# Patient Record
Sex: Female | Born: 1970 | Race: Black or African American | Hispanic: No | Marital: Single | Smoking: Former smoker
Health system: Southern US, Community
[De-identification: ages and names within clinical notes are randomized; demographics above are authoritative.]

## PROBLEM LIST (undated history)

## (undated) DIAGNOSIS — M109 Gout, unspecified: Secondary | ICD-10-CM

## (undated) DIAGNOSIS — I10 Essential (primary) hypertension: Secondary | ICD-10-CM

## (undated) DIAGNOSIS — K219 Gastro-esophageal reflux disease without esophagitis: Secondary | ICD-10-CM

## (undated) DIAGNOSIS — R011 Cardiac murmur, unspecified: Secondary | ICD-10-CM

## (undated) DIAGNOSIS — R7303 Prediabetes: Secondary | ICD-10-CM

## (undated) DIAGNOSIS — M1990 Unspecified osteoarthritis, unspecified site: Secondary | ICD-10-CM

## (undated) DIAGNOSIS — S0500XA Injury of conjunctiva and corneal abrasion without foreign body, unspecified eye, initial encounter: Secondary | ICD-10-CM

## (undated) DIAGNOSIS — R519 Headache, unspecified: Secondary | ICD-10-CM

## (undated) DIAGNOSIS — A609 Anogenital herpesviral infection, unspecified: Secondary | ICD-10-CM

## (undated) HISTORY — PX: TUBAL LIGATION: SHX77

## (undated) HISTORY — PX: WISDOM TOOTH EXTRACTION: SHX21

## (undated) HISTORY — DX: Unspecified osteoarthritis, unspecified site: M19.90

## (undated) HISTORY — DX: Gout, unspecified: M10.9

## (undated) HISTORY — DX: Essential (primary) hypertension: I10

---
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
## 1995-11-24 HISTORY — PX: TUBAL LIGATION: SHX77

## 2000-05-23 ENCOUNTER — Encounter: Payer: Self-pay | Admitting: Emergency Medicine

## 2000-05-23 ENCOUNTER — Emergency Department (HOSPITAL_COMMUNITY): Admission: EM | Admit: 2000-05-23 | Discharge: 2000-05-23 | Payer: Self-pay | Admitting: Emergency Medicine

## 2000-06-15 ENCOUNTER — Other Ambulatory Visit: Admission: RE | Admit: 2000-06-15 | Discharge: 2000-06-15 | Payer: Self-pay | Admitting: Obstetrics and Gynecology

## 2005-06-01 ENCOUNTER — Ambulatory Visit: Payer: Self-pay | Admitting: Family Medicine

## 2005-08-26 ENCOUNTER — Ambulatory Visit: Payer: Self-pay | Admitting: Internal Medicine

## 2005-10-02 ENCOUNTER — Encounter: Admission: RE | Admit: 2005-10-02 | Discharge: 2005-10-02 | Payer: Self-pay | Admitting: Neurology

## 2005-11-01 ENCOUNTER — Encounter: Admission: RE | Admit: 2005-11-01 | Discharge: 2005-11-01 | Payer: Self-pay | Admitting: Neurology

## 2006-04-02 ENCOUNTER — Other Ambulatory Visit: Admission: RE | Admit: 2006-04-02 | Discharge: 2006-04-02 | Payer: Self-pay | Admitting: Gynecology

## 2006-11-23 DIAGNOSIS — F410 Panic disorder [episodic paroxysmal anxiety] without agoraphobia: Secondary | ICD-10-CM

## 2006-11-23 HISTORY — DX: Panic disorder (episodic paroxysmal anxiety): F41.0

## 2008-04-20 ENCOUNTER — Telehealth: Payer: Self-pay | Admitting: *Deleted

## 2008-04-21 ENCOUNTER — Ambulatory Visit: Payer: Self-pay | Admitting: Family Medicine

## 2008-04-21 DIAGNOSIS — H6690 Otitis media, unspecified, unspecified ear: Secondary | ICD-10-CM | POA: Insufficient documentation

## 2008-04-21 DIAGNOSIS — L255 Unspecified contact dermatitis due to plants, except food: Secondary | ICD-10-CM | POA: Insufficient documentation

## 2008-05-03 ENCOUNTER — Other Ambulatory Visit: Admission: RE | Admit: 2008-05-03 | Discharge: 2008-05-03 | Payer: Self-pay | Admitting: Gynecology

## 2008-06-04 ENCOUNTER — Ambulatory Visit: Payer: Self-pay | Admitting: Family Medicine

## 2008-06-04 ENCOUNTER — Telehealth: Payer: Self-pay | Admitting: Family Medicine

## 2009-04-03 ENCOUNTER — Emergency Department (HOSPITAL_COMMUNITY): Admission: EM | Admit: 2009-04-03 | Discharge: 2009-04-03 | Payer: Self-pay | Admitting: Emergency Medicine

## 2009-12-20 ENCOUNTER — Ambulatory Visit: Payer: Self-pay | Admitting: Gynecology

## 2009-12-20 ENCOUNTER — Other Ambulatory Visit: Admission: RE | Admit: 2009-12-20 | Discharge: 2009-12-20 | Payer: Self-pay | Admitting: Gynecology

## 2010-02-11 ENCOUNTER — Ambulatory Visit: Payer: Self-pay | Admitting: Gynecology

## 2010-02-12 ENCOUNTER — Ambulatory Visit: Payer: Self-pay | Admitting: Gynecology

## 2010-03-10 ENCOUNTER — Emergency Department (HOSPITAL_COMMUNITY): Admission: EM | Admit: 2010-03-10 | Discharge: 2010-03-10 | Payer: Self-pay | Admitting: Emergency Medicine

## 2010-03-26 ENCOUNTER — Ambulatory Visit: Payer: Self-pay | Admitting: Gynecology

## 2010-08-13 ENCOUNTER — Ambulatory Visit: Payer: Self-pay | Admitting: Gynecology

## 2010-08-20 ENCOUNTER — Ambulatory Visit: Payer: Self-pay | Admitting: Gynecology

## 2011-02-10 LAB — POCT CARDIAC MARKERS: Myoglobin, poc: 41.9 ng/mL (ref 12–200)

## 2011-02-10 LAB — URINALYSIS, ROUTINE W REFLEX MICROSCOPIC
Protein, ur: NEGATIVE mg/dL
Urobilinogen, UA: 0.2 mg/dL (ref 0.0–1.0)

## 2011-02-10 LAB — POCT I-STAT, CHEM 8
BUN: 7 mg/dL (ref 6–23)
Calcium, Ion: 1.16 mmol/L (ref 1.12–1.32)
Creatinine, Ser: 0.9 mg/dL (ref 0.4–1.2)
Glucose, Bld: 96 mg/dL (ref 70–99)
TCO2: 27 mmol/L (ref 0–100)

## 2011-02-10 LAB — URINE MICROSCOPIC-ADD ON

## 2011-03-03 LAB — URINALYSIS, ROUTINE W REFLEX MICROSCOPIC
Glucose, UA: NEGATIVE mg/dL
Protein, ur: NEGATIVE mg/dL
pH: 7.5 (ref 5.0–8.0)

## 2011-03-03 LAB — CBC
Hemoglobin: 12.8 g/dL (ref 12.0–15.0)
MCHC: 35.6 g/dL (ref 30.0–36.0)
RDW: 12.6 % (ref 11.5–15.5)

## 2011-03-03 LAB — POCT I-STAT, CHEM 8
BUN: 10 mg/dL (ref 6–23)
Calcium, Ion: 1.21 mmol/L (ref 1.12–1.32)
Creatinine, Ser: 0.9 mg/dL (ref 0.4–1.2)
Glucose, Bld: 91 mg/dL (ref 70–99)
TCO2: 26 mmol/L (ref 0–100)

## 2011-03-03 LAB — URINE MICROSCOPIC-ADD ON

## 2011-03-03 LAB — SALICYLATE LEVEL: Salicylate Lvl: <4 mg/dL (ref 2.8–20.0)

## 2011-04-28 ENCOUNTER — Ambulatory Visit (INDEPENDENT_AMBULATORY_CARE_PROVIDER_SITE_OTHER): Payer: 59 | Admitting: Gynecology

## 2011-04-28 DIAGNOSIS — N946 Dysmenorrhea, unspecified: Secondary | ICD-10-CM

## 2011-04-28 DIAGNOSIS — N920 Excessive and frequent menstruation with regular cycle: Secondary | ICD-10-CM

## 2011-04-28 DIAGNOSIS — R823 Hemoglobinuria: Secondary | ICD-10-CM

## 2011-05-25 ENCOUNTER — Encounter: Payer: 59 | Admitting: Gynecology

## 2011-06-04 ENCOUNTER — Other Ambulatory Visit (HOSPITAL_COMMUNITY)
Admission: RE | Admit: 2011-06-04 | Discharge: 2011-06-04 | Disposition: A | Discharge status: Home / Self Care | Payer: 59 | Source: Ambulatory Visit | Attending: Gynecology | Admitting: Gynecology

## 2011-06-04 ENCOUNTER — Other Ambulatory Visit: Payer: Self-pay | Admitting: Gynecology

## 2011-06-04 ENCOUNTER — Encounter (INDEPENDENT_AMBULATORY_CARE_PROVIDER_SITE_OTHER): Payer: 59 | Admitting: Gynecology

## 2011-06-04 DIAGNOSIS — Z13220 Encounter for screening for lipoid disorders: Secondary | ICD-10-CM

## 2011-06-04 DIAGNOSIS — Z01419 Encounter for gynecological examination (general) (routine) without abnormal findings: Secondary | ICD-10-CM

## 2011-06-04 DIAGNOSIS — Z124 Encounter for screening for malignant neoplasm of cervix: Secondary | ICD-10-CM | POA: Insufficient documentation

## 2011-06-04 DIAGNOSIS — Z833 Family history of diabetes mellitus: Secondary | ICD-10-CM

## 2011-12-21 ENCOUNTER — Other Ambulatory Visit: Payer: Self-pay | Admitting: Family Medicine

## 2011-12-21 DIAGNOSIS — R109 Unspecified abdominal pain: Secondary | ICD-10-CM

## 2011-12-23 ENCOUNTER — Ambulatory Visit
Admission: RE | Admit: 2011-12-23 | Discharge: 2011-12-23 | Disposition: A | Discharge status: Home / Self Care | Payer: 59 | Source: Ambulatory Visit | Attending: Family Medicine | Admitting: Family Medicine

## 2011-12-23 DIAGNOSIS — R109 Unspecified abdominal pain: Secondary | ICD-10-CM

## 2011-12-23 MED ORDER — IOHEXOL 300 MG/ML  SOLN
125.0000 mL | Freq: Once | INTRAMUSCULAR | Status: AC | PRN
  Administered 2011-12-23: 125 mL via INTRAVENOUS

## 2012-08-04 ENCOUNTER — Other Ambulatory Visit (HOSPITAL_COMMUNITY)
Admission: RE | Admit: 2012-08-04 | Discharge: 2012-08-04 | Disposition: A | Discharge status: Home / Self Care | Payer: 59 | Source: Ambulatory Visit | Attending: Family Medicine | Admitting: Family Medicine

## 2012-08-04 ENCOUNTER — Other Ambulatory Visit: Payer: Self-pay | Admitting: Family Medicine

## 2012-08-04 DIAGNOSIS — Z0000 Encounter for general adult medical examination without abnormal findings: Secondary | ICD-10-CM | POA: Insufficient documentation

## 2012-10-26 ENCOUNTER — Encounter: Payer: Self-pay | Admitting: Gynecology

## 2012-10-26 ENCOUNTER — Ambulatory Visit (INDEPENDENT_AMBULATORY_CARE_PROVIDER_SITE_OTHER): Payer: 59 | Admitting: Gynecology

## 2012-10-26 DIAGNOSIS — B9689 Other specified bacterial agents as the cause of diseases classified elsewhere: Secondary | ICD-10-CM

## 2012-10-26 DIAGNOSIS — A499 Bacterial infection, unspecified: Secondary | ICD-10-CM

## 2012-10-26 DIAGNOSIS — R102 Pelvic and perineal pain: Secondary | ICD-10-CM

## 2012-10-26 DIAGNOSIS — N949 Unspecified condition associated with female genital organs and menstrual cycle: Secondary | ICD-10-CM

## 2012-10-26 DIAGNOSIS — N760 Acute vaginitis: Secondary | ICD-10-CM

## 2012-10-26 DIAGNOSIS — R109 Unspecified abdominal pain: Secondary | ICD-10-CM

## 2012-10-26 LAB — WET PREP FOR TRICH, YEAST, CLUE: Trich, Wet Prep: NONE SEEN

## 2012-10-26 LAB — URINALYSIS W MICROSCOPIC + REFLEX CULTURE
Bilirubin Urine: NEGATIVE
Glucose, UA: NEGATIVE mg/dL
Hgb urine dipstick: NEGATIVE
Ketones, ur: NEGATIVE mg/dL
Protein, ur: NEGATIVE mg/dL

## 2012-10-26 MED ORDER — METRONIDAZOLE 500 MG PO TABS: 500.0000 mg | ORAL_TABLET | Freq: Two times a day (BID) | ORAL | Status: DC

## 2012-10-26 NOTE — Patient Instructions (Signed)
Take Flagyl medication twice daily for 7 days.no alcohol while taking. If abdominal discomfort continues call we'll schedule ultrasound. If it resolves and follow up at your planned annual exam.

## 2012-10-26 NOTE — Progress Notes (Signed)
Patient presents with two-day history of lower abdominal discomfort. This is she feels pressure symptoms and gassy. Also some vaginal moisture no real discharge no odor or itching. Menses are regular, relatively light.  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 Cervix normal. Uterus normal size midline mobile nontender. Adnexa without masses or tenderness. Rectovaginal exam is normal.  Assessment and plan:  1. Slight white discharge with wet prep consistent with bacterial vaginosis. Treat with Flagyl 500 mg twice a day x7 days. 2. Lower abdominal pressure symptoms. Urinalysis cloudy but no other abnormalities such as white cells or hemoglobin. Check culture and follow up. Cervical culture GC Chlamydia also done. With constipation symptoms and gassy complaints recommend pushing fluids and starting on a stool softener. We'll see if this doesn't resolve her discomfort. If so then we'll follow expectantly with planned annual exam scheduled 17 December. If discomfort continues she knows to call and we'll schedule a GYN ultrasound. Follow up on urine culture and cervical cultures and treat accordingly.

## 2012-10-28 LAB — URINE CULTURE: Colony Count: 9000

## 2012-11-08 ENCOUNTER — Encounter: Payer: 59 | Admitting: Gynecology

## 2012-11-09 ENCOUNTER — Encounter: Payer: Self-pay | Admitting: Gynecology

## 2012-11-09 ENCOUNTER — Ambulatory Visit (INDEPENDENT_AMBULATORY_CARE_PROVIDER_SITE_OTHER): Payer: 59 | Admitting: Gynecology

## 2012-11-09 VITALS — BP 130/70 | Ht 63.0 in | Wt 224.0 lb

## 2012-11-09 DIAGNOSIS — Z13220 Encounter for screening for lipoid disorders: Secondary | ICD-10-CM

## 2012-11-09 DIAGNOSIS — Z01419 Encounter for gynecological examination (general) (routine) without abnormal findings: Secondary | ICD-10-CM

## 2012-11-09 DIAGNOSIS — R1032 Left lower quadrant pain: Secondary | ICD-10-CM

## 2012-11-09 DIAGNOSIS — R350 Frequency of micturition: Secondary | ICD-10-CM

## 2012-11-09 LAB — COMPREHENSIVE METABOLIC PANEL
Alkaline Phosphatase: 58 U/L (ref 39–117)
CO2: 28 mEq/L (ref 19–32)
Creat: 0.91 mg/dL (ref 0.50–1.10)
Glucose, Bld: 90 mg/dL (ref 70–99)
Sodium: 140 mEq/L (ref 135–145)
Total Bilirubin: 0.5 mg/dL (ref 0.3–1.2)
Total Protein: 6.7 g/dL (ref 6.0–8.3)

## 2012-11-09 LAB — CBC WITH DIFFERENTIAL/PLATELET
Basophils Relative: 0 % (ref 0–1)
Eosinophils Absolute: 0.1 10*3/uL (ref 0.0–0.7)
Eosinophils Relative: 1 % (ref 0–5)
Hemoglobin: 12.9 g/dL (ref 12.0–15.0)
MCH: 31 pg (ref 26.0–34.0)
MCHC: 33.7 g/dL (ref 30.0–36.0)
MCV: 92.1 fL (ref 78.0–100.0)
Monocytes Relative: 10 % (ref 3–12)
Neutrophils Relative %: 51 % (ref 43–77)
Platelets: 358 10*3/uL (ref 150–400)

## 2012-11-09 LAB — LIPID PANEL
Cholesterol: 164 mg/dL (ref 0–200)
HDL: 49 mg/dL (ref >39)
LDL Cholesterol: 96 mg/dL (ref 0–99)
Total CHOL/HDL Ratio: 3.3 Ratio
Triglycerides: 96 mg/dL (ref <150)
VLDL: 19 mg/dL (ref 0–40)

## 2012-11-09 LAB — URINALYSIS W MICROSCOPIC + REFLEX CULTURE
Bilirubin Urine: NEGATIVE
Hgb urine dipstick: NEGATIVE
Ketones, ur: NEGATIVE mg/dL
Nitrite: NEGATIVE
Urobilinogen, UA: 0.2 mg/dL (ref 0.0–1.0)

## 2012-11-09 NOTE — Progress Notes (Signed)
ESLY SELVAGE 1971/02/12 [id number]        41 y.o.  G3P0010 for annual exam.    Past medical history,surgical history, medications, allergies, family history and social history were all reviewed and documented in the EPIC chart. ROS:  Was performed and pertinent positives and negatives are included in the history.  Exam: Sherrilyn Rist assistant Filed Vitals:   11/09/12 1125  BP: 130/70  Height: 5\' 3"  (1.6 m)  Weight: 224 lb (101.606 kg)   General appearance  Normal Skin grossly normal Head/Neck normal with no cervical or supraclavicular adenopathy thyroid normal Lungs  clear Cardiac RR, without RMG Abdominal  soft, nontender, without masses, organomegaly or hernia Breasts  examined lying and sitting without masses, retractions, discharge or axillary adenopathy. Pelvic  Ext/BUS/vagina  normal   Cervix  normal   Uterus  axial, normal size, shape and contour, midline and mobile nontender   Adnexa  Without masses or tenderness    Anus and perineum  normal   Rectovaginal  normal sphincter tone without palpated masses or tenderness.    Assessment/Plan:  41 y.o. G43P0010 female for annual exam, regular menses, tubal sterilization.   1. Left lower quadrant discomfort.  Was seen in early December with similar complaints. Felt due to constipation. GC Chlamydia screen negative urinalysis negative. Patient increased her fiber and stooling and notes some improvement. Still has some nagging left lower quadrant discomfort. Her exam is normal we'll start with ultrasound rule out nonpalpable abnormalities. Assuming negative then we'll plan expectant management at present. We'll continue they consider GI referral. Urinalysis negative today. 2. Fleeting right and left breast discomfort. Comes and goes. No persistent pain or masses. Exam is normal. Discussed with patient that I think this is physiologic in response to her menstrual cycle. Recommend baseline mammogram now she agrees to schedule. Otherwise  assuming negative she'll continue with suppressed exams monthly and as long as no persistent palpable abnormalities or persistent pain we'll follow expectantly. 3. Pap smear 07/2012 period was just done by primary during a visit in its normal. No history of significant abnormalities and recommend repeat at 3 year interval as HPV was not done with it. 4. Health maintenance. Check baseline CBC comprehensive metabolic panel lipid profile. Follow for ultrasound, otherwise annually.   Dara Lords MD, 12:03 PM 11/09/2012

## 2012-11-09 NOTE — Patient Instructions (Signed)
Follow up for ultrasound as scheduled 

## 2012-11-25 ENCOUNTER — Ambulatory Visit: Payer: 59 | Admitting: Gynecology

## 2012-11-25 ENCOUNTER — Other Ambulatory Visit: Payer: 59

## 2012-12-16 ENCOUNTER — Encounter: Payer: Self-pay | Admitting: Gynecology

## 2012-12-16 ENCOUNTER — Ambulatory Visit (INDEPENDENT_AMBULATORY_CARE_PROVIDER_SITE_OTHER): Payer: 59 | Admitting: Gynecology

## 2012-12-16 ENCOUNTER — Ambulatory Visit: Payer: 59

## 2012-12-16 DIAGNOSIS — R1032 Left lower quadrant pain: Secondary | ICD-10-CM

## 2012-12-16 DIAGNOSIS — IMO0002 Reserved for concepts with insufficient information to code with codable children: Secondary | ICD-10-CM

## 2012-12-16 NOTE — Patient Instructions (Signed)
Make a follow up appointment with gastroenterology to follow up with about your abdominal pain.

## 2012-12-16 NOTE — Progress Notes (Signed)
Patient follows up for ultrasound due to left lower quadrant discomfort.  Ultrasound shows uterus normal size and echotexture. Endometrial echo 5.8 mm. Right and left ovaries are visualized and normal. Cul-de-sac negative.  Assessment and plan: Lower bowel discomfort, ultrasound is normal. I think this is more GI related and she has a name of a gastroenterologist and she is going to call and make an appointment to see them.

## 2013-06-23 ENCOUNTER — Emergency Department (HOSPITAL_COMMUNITY): Payer: 59

## 2013-06-23 ENCOUNTER — Emergency Department (HOSPITAL_COMMUNITY)
Admission: EM | Admit: 2013-06-23 | Discharge: 2013-06-23 | Disposition: A | Discharge status: Home / Self Care | Payer: 59 | Attending: Emergency Medicine | Admitting: Emergency Medicine

## 2013-06-23 ENCOUNTER — Encounter (HOSPITAL_COMMUNITY): Payer: Self-pay | Admitting: *Deleted

## 2013-06-23 DIAGNOSIS — M79601 Pain in right arm: Secondary | ICD-10-CM

## 2013-06-23 DIAGNOSIS — Z87891 Personal history of nicotine dependence: Secondary | ICD-10-CM | POA: Insufficient documentation

## 2013-06-23 DIAGNOSIS — Z79899 Other long term (current) drug therapy: Secondary | ICD-10-CM | POA: Insufficient documentation

## 2013-06-23 DIAGNOSIS — I10 Essential (primary) hypertension: Secondary | ICD-10-CM | POA: Insufficient documentation

## 2013-06-23 DIAGNOSIS — R209 Unspecified disturbances of skin sensation: Secondary | ICD-10-CM | POA: Insufficient documentation

## 2013-06-23 DIAGNOSIS — M79609 Pain in unspecified limb: Secondary | ICD-10-CM | POA: Insufficient documentation

## 2013-06-23 LAB — POCT I-STAT, CHEM 8
BUN: 8 mg/dL (ref 6–23)
Calcium, Ion: 1.18 mmol/L (ref 1.12–1.23)
Chloride: 105 mEq/L (ref 96–112)
Creatinine, Ser: 1 mg/dL (ref 0.50–1.10)
Glucose, Bld: 107 mg/dL — ABNORMAL HIGH (ref 70–99)
Potassium: 3.9 mEq/L (ref 3.5–5.1)

## 2013-06-23 LAB — CBC
Hemoglobin: 12.7 g/dL (ref 12.0–15.0)
MCH: 32.4 pg (ref 26.0–34.0)
MCHC: 35.7 g/dL (ref 30.0–36.0)
Platelets: 309 10*3/uL (ref 150–400)
RDW: 13.4 % (ref 11.5–15.5)

## 2013-06-23 MED ORDER — HYDROMORPHONE HCL PF 1 MG/ML IJ SOLN
1.0000 mg | Freq: Once | INTRAMUSCULAR | Status: AC
  Administered 2013-06-23: 1 mg via INTRAMUSCULAR
  Filled 2013-06-23: qty 1

## 2013-06-23 MED ORDER — TRAMADOL HCL 50 MG PO TABS: 50.0000 mg | ORAL_TABLET | Freq: Four times a day (QID) | ORAL | Status: DC | PRN

## 2013-06-23 MED ORDER — ONDANSETRON 4 MG PO TBDP
4.0000 mg | ORAL_TABLET | Freq: Once | ORAL | Status: AC
  Administered 2013-06-23: 4 mg via ORAL
  Filled 2013-06-23: qty 1

## 2013-06-23 NOTE — ED Notes (Signed)
Back from xray

## 2013-06-23 NOTE — ED Notes (Signed)
Pt to xray, no changes, alert, NAD, calm. 

## 2013-06-23 NOTE — ED Notes (Addendum)
C/o R arm pain, (hand to shoulder and axilla). Also describes as numbness. (Denies: neck, back, scapular, chest or abd pain; Denies: injury, nvd, fever, recent illness, cough, congestion cold sx, dizziness, palpitations, sob). No aggravating or aleviating factors. H/o similar. Describes as recurrent. Comes and goes. Has been on mobic and muscle relaxers for similar sx. Was sleeping at onset. Woke pt up around 0400. No aggravating or aleviating factors. No meds PTA.

## 2013-06-23 NOTE — ED Notes (Signed)
EDP finished at [facility], lab into room, pt given warm blanket.

## 2013-06-23 NOTE — ED Provider Notes (Signed)
CSN: [id number]     Arrival date & time 06/23/13  0445 History     First MD Initiated Contact with Patient 06/23/13 0445     Chief Complaint  Patient presents with  . Arm Pain   (Consider location/radiation/quality/duration/timing/severity/associated sxs/prior Treatment) HPI History provided by patient. Right arm pain from her shoulder to her hand with associated numbness on-and-off for the last year. No known aggravating or alleviating factors. No injury. Not affected by movement. She denies any neck pain. Patient is mostly worried due to family history of lung cancer - her grandmother had arm pain and was later diagnosed as pain related to lung mass. She was a previous smoker. No shortness of breath. No chest pain. No history of diabetes. She takes furosemide for hypertension. Pain started this time around 4 AM, she is very anxious today, no new symptoms. Past Medical History  Diagnosis Date  . Hypertension    Past Surgical History  Procedure Laterality Date  . Tubal ligation      POST PARTUM   Family History  Problem Relation Age of Onset  . Cancer Maternal Aunt     LUNG  . Diabetes Maternal Grandmother   . Hypertension Maternal Grandmother    History  Substance Use Topics  . Smoking status: Former Games developer  . Smokeless tobacco: Never Used     Comment: was only a social smoker  . Alcohol Use: Yes   OB History   Grav Para Term Preterm Abortions TAB SAB Ect Mult Living   3 2   1  1         Review of Systems  Constitutional: Negative for fever and chills.  HENT: Negative for neck pain and neck stiffness.   Eyes: Negative for pain.  Respiratory: Negative for shortness of breath.   Cardiovascular: Negative for chest pain.  Gastrointestinal: Negative for abdominal pain.  Genitourinary: Negative for dysuria.  Musculoskeletal: Negative for back pain.  Skin: Negative for rash.  Neurological: Positive for numbness. Negative for headaches.  All other systems reviewed and are  negative.    Allergies  Review of patient's allergies indicates no known allergies.  Home Medications   Current Outpatient Rx  Name  Route  Sig  Dispense  Refill  . FUROSEMIDE PO   Oral   Take by mouth.          BP 119/76  Pulse 65  Temp(Src) 98.6 F (37 C) (Oral)  Resp 18  SpO2 100%  LMP 05/29/2013 Physical Exam  Constitutional: She is oriented to person, place, and time. She appears well-developed and well-nourished.  HENT:  Head: Normocephalic and atraumatic.  Eyes: EOM are normal. Pupils are equal, round, and reactive to light.  Neck: Neck supple.  No C-spine tenderness or deformity  Cardiovascular: Normal rate, regular rhythm and intact distal pulses.   Pulmonary/Chest: Effort normal. No respiratory distress. She exhibits no tenderness.  Abdominal: Soft. Bowel sounds are normal.  Musculoskeletal: Normal range of motion. She exhibits no edema and no tenderness.  Equal grips/biceps/biceps strengths. Distal neurovascular intact and equal to upper extremities. Sensorium to light touch equal and intact throughout. No swelling or edema. No erythema or increased warmth to touch.  Neurological: She is alert and oriented to person, place, and time.  Skin: Skin is warm and dry.    ED Course   Procedures (including critical care time)  Results for orders placed during the hospital encounter of 06/23/13  CBC      Result Value Range  WBC 8.3  4.0 - 10.5 K/uL   RBC 3.92  3.87 - 5.11 MIL/uL   Hemoglobin 12.7  12.0 - 15.0 g/dL   HCT 45.4 (*) 09.8 - 11.9 %   MCV 90.8  78.0 - 100.0 fL   MCH 32.4  26.0 - 34.0 pg   MCHC 35.7  30.0 - 36.0 g/dL   RDW 14.7  82.9 - 56.2 %   Platelets 309  150 - 400 K/uL  POCT I-STAT, CHEM 8      Result Value Range   Sodium 140  135 - 145 mEq/L   Potassium 3.9  3.5 - 5.1 mEq/L   Chloride 105  96 - 112 mEq/L   BUN 8  6 - 23 mg/dL   Creatinine, Ser 1.30  0.50 - 1.10 mg/dL   Glucose, Bld 865 (*) 70 - 99 mg/dL   Calcium, Ion 7.84  6.96 -  1.23 mmol/L   TCO2 22  0 - 100 mmol/L   Hemoglobin 12.9  12.0 - 15.0 g/dL   HCT 29.5  28.4 - 13.2 %  POCT I-STAT TROPONIN I      Result Value Range   Troponin i, poc 0.00  0.00 - 0.08 ng/mL   Comment 3            Dg Chest 2 View  06/23/2013   *RADIOLOGY REPORT*  Clinical Data: Chest and arm pain.  CHEST - 2 VIEW  Comparison: 03/10/2010.  Findings: No significant osseous abnormality.  Lungs are clear. No effusion or pneumothorax.  Cardiomediastinal size and contour are within normal limits.  The upper abdomen is unremarkable.  IMPRESSION: No evidence of acute cardiopulmonary disease.   Original Report Authenticated By: Tiburcio Pea     Date: 06/23/2013  Rate: 65  Rhythm: normal sinus rhythm  QRS Axis: normal  Intervals: normal  ST/T Wave abnormalities: nonspecific ST changes  Conduction Disutrbances:none  Narrative Interpretation:   Old EKG Reviewed: none available  IM Dilaudid  6:35 AM pain improved. Imaging and lab tests and EKG results reviewed with patient. She is followed by Deboraha Sprang primary care physician's agrees to call today to schedule recheck. Prescription provided. Recommending heat, rest and return here for any chest pain, trouble breathing or any swelling or worsening condition. Patient agrees to strict return precautions and all discharge and followup instructions. MDM   Right arm pain on and off for the last year. Patient is expressing concern about possible lung cancer has her on that arm pain prior to being diagnosed with a lung mass. EKG. Chest x-ray. Labs. No neck pain and symptoms not reproducible. No swelling or clinical DVT. No reported trauma and denies any heavy lifting or lifting over her head.   Pain improved with IM narcotics  Vital signs nurse's notes reviewed and considered   Sunnie Nielsen, MD 06/23/13 [phone]

## 2013-12-20 ENCOUNTER — Other Ambulatory Visit: Payer: Self-pay | Admitting: Family

## 2013-12-20 ENCOUNTER — Ambulatory Visit
Admission: RE | Admit: 2013-12-20 | Discharge: 2013-12-20 | Disposition: A | Discharge status: Home / Self Care | Payer: 59 | Source: Ambulatory Visit | Attending: Family | Admitting: Family

## 2013-12-20 DIAGNOSIS — M542 Cervicalgia: Secondary | ICD-10-CM

## 2013-12-28 ENCOUNTER — Other Ambulatory Visit: Payer: Self-pay | Admitting: Otolaryngology

## 2013-12-28 DIAGNOSIS — H9319 Tinnitus, unspecified ear: Secondary | ICD-10-CM

## 2014-06-22 ENCOUNTER — Other Ambulatory Visit: Payer: Self-pay

## 2014-06-22 DIAGNOSIS — Z1231 Encounter for screening mammogram for malignant neoplasm of breast: Secondary | ICD-10-CM

## 2014-07-25 ENCOUNTER — Ambulatory Visit
Admission: RE | Admit: 2014-07-25 | Discharge: 2014-07-25 | Disposition: A | Discharge status: Home / Self Care | Payer: 59 | Source: Ambulatory Visit

## 2014-07-25 DIAGNOSIS — Z1231 Encounter for screening mammogram for malignant neoplasm of breast: Secondary | ICD-10-CM

## 2014-07-27 ENCOUNTER — Other Ambulatory Visit: Payer: Self-pay

## 2014-07-27 DIAGNOSIS — R928 Other abnormal and inconclusive findings on diagnostic imaging of breast: Secondary | ICD-10-CM

## 2014-08-06 ENCOUNTER — Encounter: Payer: Self-pay | Admitting: Gynecology

## 2014-08-06 ENCOUNTER — Ambulatory Visit (INDEPENDENT_AMBULATORY_CARE_PROVIDER_SITE_OTHER): Payer: 59 | Admitting: Gynecology

## 2014-08-06 VITALS — BP 120/70 | Ht 63.5 in | Wt 242.0 lb

## 2014-08-06 DIAGNOSIS — Z01419 Encounter for gynecological examination (general) (routine) without abnormal findings: Secondary | ICD-10-CM

## 2014-08-06 NOTE — Progress Notes (Signed)
Jessica Contreras [birth year] [id number]        43 y.o.  [id number] for annual exam.  Several issues noted below.  Past medical history,surgical history, problem list, medications, allergies, family history and social history were all reviewed and documented as reviewed in the EPIC chart.  ROS:  12 system ROS performed with pertinent positives and negatives included in the history, assessment and plan.   Additional significant findings :  none   Exam: Kim Counsellor Vitals:   08/06/14 1455  BP: 120/70  Height: 5' 3.5" (1.613 m)  Weight: 242 lb (109.77 kg)   General appearance:  Normal affect, orientation and appearance. Skin: Grossly normal HEENT: Without gross lesions.  No cervical or supraclavicular adenopathy. Thyroid normal.  Lungs:  Clear without wheezing, rales or rhonchi Cardiac: RR, without RMG Abdominal:  Soft, nontender, without masses, guarding, rebound, organomegaly or hernia Breasts:  Examined lying and sitting without masses, retractions, discharge or axillary adenopathy. Pelvic:  Ext/BUS/vagina normal  Cervix normal  Uterus anteverted, grossly normal size, midline and mobile nontender   Adnexa  Without masses or tenderness    Anus and perineum  Normal   Rectovaginal  Normal sphincter tone without palpated masses or tenderness.    Assessment/Plan:  43 y.o. [id number] female for annual exam with regular menses, tubal sterilization.   1. History of left lower quadrant discomfort evaluated a year and a half ago. Never followed up with gastroenterology as I recommended. Having had a negative GYN ultrasound and exam.  Still having some pain not as bad. Again recommended she follow up with gastroenterology if it continues. 2. Pap smear 2013. No Pap smear done today. No history of significant abnormal Pap smears previously. Plan repeat Pap smear next year a 3 year interval. 3. Mammography 07/2014.  Has follow up views scheduled in 2 days due to shadow. Patient will follow for  this and assuming gets cleared will follow up per radiology recommendation. SBE monthly reviewed. 4. Health maintenance. Patient reports routine blood work done at her primary physician's office. Follow up one year, sooner as needed.   Note: This document was prepared with digital dictation and possible smart phrase technology. Any transcriptional errors that result from this process are unintentional.   Anastasio Auerbach MD, 3:23 PM 08/06/2014

## 2014-08-06 NOTE — Patient Instructions (Signed)
Follow up in one year for annual exam, sooner if any issues.  You may obtain a copy of any labs that were done today by logging onto MyChart as outlined in the instructions provided with your AVS (after visit summary). The office will not call with normal lab results but certainly if there are any significant abnormalities then we will contact you.   Health Maintenance, Female A healthy lifestyle and preventative care can promote health and wellness.  Maintain regular health, dental, and eye exams.  Eat a healthy diet. Foods like vegetables, fruits, whole grains, low-fat dairy products, and lean protein foods contain the nutrients you need without too many calories. Decrease your intake of foods high in solid fats, added sugars, and salt. Get information about a proper diet from your caregiver, if necessary.  Regular physical exercise is one of the most important things you can do for your health. Most adults should get at least 150 minutes of moderate-intensity exercise (any activity that increases your heart rate and causes you to sweat) each week. In addition, most adults need muscle-strengthening exercises on 2 or more days a week.   Maintain a healthy weight. The body mass index (BMI) is a screening tool to identify possible weight problems. It provides an estimate of body fat based on height and weight. Your caregiver can help determine your BMI, and can help you achieve or maintain a healthy weight. For adults 20 years and older:  A BMI below 18.5 is considered underweight.  A BMI of 18.5 to 24.9 is normal.  A BMI of 25 to 29.9 is considered overweight.  A BMI of 30 and above is considered obese.  Maintain normal blood lipids and cholesterol by exercising and minimizing your intake of saturated fat. Eat a balanced diet with plenty of fruits and vegetables. Blood tests for lipids and cholesterol should begin at age 57 and be repeated every 5 years. If your lipid or cholesterol levels  are high, you are over 50, or you are a high risk for heart disease, you may need your cholesterol levels checked more frequently.Ongoing high lipid and cholesterol levels should be treated with medicines if diet and exercise are not effective.  If you smoke, find out from your caregiver how to quit. If you do not use tobacco, do not start.  Lung cancer screening is recommended for adults aged 33 80 years who are at high risk for developing lung cancer because of a history of smoking. Yearly low-dose computed tomography (CT) is recommended for people who have at least a 30-pack-year history of smoking and are a current smoker or have quit within the past 15 years. A pack year of smoking is smoking an average of 1 pack of cigarettes a day for 1 year (for example: 1 pack a day for 30 years or 2 packs a day for 15 years). Yearly screening should continue until the smoker has stopped smoking for at least 15 years. Yearly screening should also be stopped for people who develop a health problem that would prevent them from having lung cancer treatment.  If you are pregnant, do not drink alcohol. If you are breastfeeding, be very cautious about drinking alcohol. If you are not pregnant and choose to drink alcohol, do not exceed 1 drink per day. One drink is considered to be 12 ounces (355 mL) of beer, 5 ounces (148 mL) of wine, or 1.5 ounces (44 mL) of liquor.  Avoid use of street drugs. Do not share needles  with anyone. Ask for help if you need support or instructions about stopping the use of drugs.  High blood pressure causes heart disease and increases the risk of stroke. Blood pressure should be checked at least every 1 to 2 years. Ongoing high blood pressure should be treated with medicines, if weight loss and exercise are not effective.  If you are 4 to 42 years old, ask your caregiver if you should take aspirin to prevent strokes.  Diabetes screening involves taking a blood sample to check your  fasting blood sugar level. This should be done once every 3 years, after age 74, if you are within normal weight and without risk factors for diabetes. Testing should be considered at a younger age or be carried out more frequently if you are overweight and have at least 1 risk factor for diabetes.  Breast cancer screening is essential preventative care for women. You should practice "breast self-awareness." This means understanding the normal appearance and feel of your breasts and may include breast self-examination. Any changes detected, no matter how small, should be reported to a caregiver. Women in their 71s and 30s should have a clinical breast exam (CBE) by a caregiver as part of a regular health exam every 1 to 3 years. After age 53, women should have a CBE every year. Starting at age 59, women should consider having a mammogram (breast X-ray) every year. Women who have a family history of breast cancer should talk to their caregiver about genetic screening. Women at a high risk of breast cancer should talk to their caregiver about having an MRI and a mammogram every year.  Breast cancer gene (BRCA)-related cancer risk assessment is recommended for women who have family members with BRCA-related cancers. BRCA-related cancers include breast, ovarian, tubal, and peritoneal cancers. Having family members with these cancers may be associated with an increased risk for harmful changes (mutations) in the breast cancer genes BRCA1 and BRCA2. Results of the assessment will determine the need for genetic counseling and BRCA1 and BRCA2 testing.  The Pap test is a screening test for cervical cancer. Women should have a Pap test starting at age 51. Between ages 11 and 57, Pap tests should be repeated every 2 years. Beginning at age 1, you should have a Pap test every 3 years as long as the past 3 Pap tests have been normal. If you had a hysterectomy for a problem that was not cancer or a condition that could  lead to cancer, then you no longer need Pap tests. If you are between ages 54 and 56, and you have had normal Pap tests going back 10 years, you no longer need Pap tests. If you have had past treatment for cervical cancer or a condition that could lead to cancer, you need Pap tests and screening for cancer for at least 20 years after your treatment. If Pap tests have been discontinued, risk factors (such as a new sexual partner) need to be reassessed to determine if screening should be resumed. Some women have medical problems that increase the chance of getting cervical cancer. In these cases, your caregiver may recommend more frequent screening and Pap tests.  The human papillomavirus (HPV) test is an additional test that may be used for cervical cancer screening. The HPV test looks for the virus that can cause the cell changes on the cervix. The cells collected during the Pap test can be tested for HPV. The HPV test could be used to screen women aged  30 years and older, and should be used in women of any age who have unclear Pap test results. After the age of 30, women should have HPV testing at the same frequency as a Pap test.  Colorectal cancer can be detected and often prevented. Most routine colorectal cancer screening begins at the age of 50 and continues through age 75. However, your caregiver may recommend screening at an earlier age if you have risk factors for colon cancer. On a yearly basis, your caregiver may provide home test kits to check for hidden blood in the stool. Use of a small camera at the end of a tube, to directly examine the colon (sigmoidoscopy or colonoscopy), can detect the earliest forms of colorectal cancer. Talk to your caregiver about this at age 50, when routine screening begins. Direct examination of the colon should be repeated every 5 to 10 years through age 75, unless early forms of pre-cancerous polyps or small growths are found.  Hepatitis C blood testing is  recommended for all people born from 1945 through 1965 and any individual with known risks for hepatitis C.  Practice safe sex. Use condoms and avoid high-risk sexual practices to reduce the spread of sexually transmitted infections (STIs). Sexually active women aged 25 and younger should be checked for Chlamydia, which is a common sexually transmitted infection. Older women with new or multiple partners should also be tested for Chlamydia. Testing for other STIs is recommended if you are sexually active and at increased risk.  Osteoporosis is a disease in which the bones lose minerals and strength with aging. This can result in serious bone fractures. The risk of osteoporosis can be identified using a bone density scan. Women ages 65 and over and women at risk for fractures or osteoporosis should discuss screening with their caregivers. Ask your caregiver whether you should be taking a calcium supplement or vitamin D to reduce the rate of osteoporosis.  Menopause can be associated with physical symptoms and risks. Hormone replacement therapy is available to decrease symptoms and risks. You should talk to your caregiver about whether hormone replacement therapy is right for you.  Use sunscreen. Apply sunscreen liberally and repeatedly throughout the day. You should seek shade when your shadow is shorter than you. Protect yourself by wearing long sleeves, pants, a wide-brimmed hat, and sunglasses year round, whenever you are outdoors.  Notify your caregiver of new moles or changes in moles, especially if there is a change in shape or color. Also notify your caregiver if a mole is larger than the size of a pencil eraser.  Stay current with your immunizations. Document Released: 05/25/2011 Document Revised: 03/06/2013 Document Reviewed: 05/25/2011 [employer] Patient Information 2014 [employer].   

## 2014-08-07 LAB — URINALYSIS W MICROSCOPIC + REFLEX CULTURE
Bilirubin Urine: NEGATIVE
CASTS: NONE SEEN
Crystals: NONE SEEN
Glucose, UA: NEGATIVE mg/dL
HGB URINE DIPSTICK: NEGATIVE
KETONES UR: NEGATIVE mg/dL
Leukocytes, UA: NEGATIVE
NITRITE: NEGATIVE
PH: 6.5 (ref 5.0–8.0)
PROTEIN: NEGATIVE mg/dL
Specific Gravity, Urine: 1.017 (ref 1.005–1.030)
UROBILINOGEN UA: 0.2 mg/dL (ref 0.0–1.0)

## 2014-08-08 ENCOUNTER — Ambulatory Visit
Admission: RE | Admit: 2014-08-08 | Discharge: 2014-08-08 | Disposition: A | Discharge status: Home / Self Care | Payer: 59 | Source: Ambulatory Visit | Attending: *Deleted | Admitting: *Deleted

## 2014-08-08 DIAGNOSIS — R928 Other abnormal and inconclusive findings on diagnostic imaging of breast: Secondary | ICD-10-CM

## 2014-08-08 LAB — URINE CULTURE

## 2014-09-24 ENCOUNTER — Encounter: Payer: Self-pay | Admitting: Gynecology

## 2014-10-31 ENCOUNTER — Encounter: Payer: Self-pay | Admitting: Gynecology

## 2014-10-31 ENCOUNTER — Ambulatory Visit (INDEPENDENT_AMBULATORY_CARE_PROVIDER_SITE_OTHER): Payer: 59 | Admitting: Gynecology

## 2014-10-31 DIAGNOSIS — R300 Dysuria: Secondary | ICD-10-CM

## 2014-10-31 LAB — URINALYSIS W MICROSCOPIC + REFLEX CULTURE
Bilirubin Urine: NEGATIVE
Glucose, UA: NEGATIVE mg/dL
HGB URINE DIPSTICK: NEGATIVE
KETONES UR: NEGATIVE mg/dL
Leukocytes, UA: NEGATIVE
NITRITE: NEGATIVE
PH: 5.5 (ref 5.0–8.0)
Protein, ur: NEGATIVE mg/dL
Specific Gravity, Urine: 1.025 (ref 1.005–1.030)
Urobilinogen, UA: 0.2 mg/dL (ref 0.0–1.0)

## 2014-10-31 MED ORDER — SULFAMETHOXAZOLE-TRIMETHOPRIM 800-160 MG PO TABS
1.0000 | ORAL_TABLET | Freq: Two times a day (BID) | ORAL | Status: DC
Start: 2014-10-31 — End: 2015-11-14

## 2014-10-31 NOTE — Progress Notes (Signed)
Jessica Contreras Dec 28, 1970 [id number]        43 y.o.  [id number] Presents with three-day history of frequency and dysuria. No low back pain fever chills nausea vomiting vaginal discharge odor or irritation.  Past medical history,surgical history, problem list, medications, allergies, family history and social history were all reviewed and documented in the EPIC chart.  Directed ROS with pertinent positives and negatives documented in the history of present illness/assessment and plan.  Exam: Kim assistant General appearance:  Normal Spine straight without CVA tenderness Abdomen soft nontender without masses guarding rebound Pelvic external BUS vagina normal. Cervix normal. Uterus grossly normal size midline mobile nontender. Adnexa without masses or tenderness.  Assessment/Plan:  43 y.o. [id number] with symptoms strongly suggestive of early UTI although urinalysis is negative. Will treat as early urethritis with Septra DS 1 by mouth twice a day 3 days. Follow up if symptoms persist, worsen or recur.     Anastasio Auerbach MD, 3:15 PM 10/31/2014

## 2014-10-31 NOTE — Patient Instructions (Signed)
Take the antibiotic twice daily for 3 days.  Follow-up if your symptoms persist, worsen or recur. 

## 2015-01-16 ENCOUNTER — Other Ambulatory Visit: Payer: Self-pay | Admitting: Family

## 2015-01-16 DIAGNOSIS — N63 Unspecified lump in unspecified breast: Secondary | ICD-10-CM

## 2015-01-25 ENCOUNTER — Ambulatory Visit
Admission: RE | Admit: 2015-01-25 | Discharge: 2015-01-25 | Disposition: A | Discharge status: Home / Self Care | Payer: Self-pay | Source: Ambulatory Visit | Attending: Family | Admitting: Family

## 2015-01-25 DIAGNOSIS — N63 Unspecified lump in unspecified breast: Secondary | ICD-10-CM

## 2015-02-12 ENCOUNTER — Encounter (HOSPITAL_COMMUNITY): Payer: Self-pay | Admitting: *Deleted

## 2015-02-12 ENCOUNTER — Emergency Department (HOSPITAL_COMMUNITY)
Admission: EM | Admit: 2015-02-12 | Discharge: 2015-02-12 | Disposition: A | Discharge status: Home / Self Care | Payer: 59 | Source: Home / Self Care | Attending: Family Medicine | Admitting: Family Medicine

## 2015-02-12 DIAGNOSIS — M79604 Pain in right leg: Secondary | ICD-10-CM | POA: Diagnosis not present

## 2015-02-12 DIAGNOSIS — M79601 Pain in right arm: Secondary | ICD-10-CM

## 2015-02-12 NOTE — Discharge Instructions (Signed)
Wear splint for comfort, have ergonomic evaluation of work site. See your doctor if further problems.

## 2015-02-12 NOTE — ED Provider Notes (Signed)
CSN: [id number]     Arrival date & time 02/12/15  1150 History   First MD Initiated Contact with Patient 02/12/15 1330     Chief Complaint  Patient presents with  . Arm Problem   (Consider location/radiation/quality/duration/timing/severity/associated sxs/prior Treatment) Patient is a 44 y.o. female presenting with arm injury. The history is provided by the patient.  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 Hypertension    Past Surgical History  Procedure Laterality Date  . Tubal ligation      POST PARTUM   Family History  Problem Relation Age of Onset  . Cancer Maternal Aunt     LUNG  . Diabetes Maternal Grandmother   . Hypertension Maternal Grandmother   . Sarcoidosis Mother    History  Substance Use Topics  . Smoking status: Never Smoker   . Smokeless tobacco: Never Used     Comment: was only a social smoker  . Alcohol Use: 1.5 oz/week    3 drink(s) per week   OB History    Gravida Para Term Preterm AB TAB SAB Ectopic Multiple Living   3 2   1  1   2      Review of Systems  Constitutional: Negative.   Cardiovascular: Positive for chest pain.  Gastrointestinal: Negative.   Neurological: Negative for dizziness, tremors, weakness, light-headedness, numbness and headaches.    Allergies  Review of patient's allergies indicates no known allergies.  Home Medications   Prior to Admission medications   Medication Sig Start Date End Date Taking? Authorizing Provider  sulfamethoxazole-trimethoprim (SEPTRA DS) 800-160 MG per tablet Take 1 tablet by mouth 2 (two) times daily. For  3 days 10/31/14   Anastasio Auerbach, MD   BP 136/82 mmHg  Pulse 66  Temp(Src) 97.9 F (36.6 C) (Oral)  Resp 12  SpO2 100%  LMP 01/30/2015 Physical Exam  Constitutional: She is oriented to person, place, and time. She appears well-developed and well-nourished.  Neck: Normal range of motion. Neck supple.  Pulmonary/Chest: She exhibits tenderness.  Abdominal: Soft. Bowel sounds are normal.  Musculoskeletal: Normal range of motion. She exhibits no tenderness.       Right forearm: She exhibits no tenderness, no bony tenderness, no swelling, no edema and no deformity.       Arms: Lymphadenopathy:    She has no cervical adenopathy.  Neurological: She is alert and oriented to person, place, and time.  Skin: Skin is warm and dry.  Nursing note and vitals reviewed.   ED Course  Procedures (including critical care time) Labs Review Labs Reviewed - No data to display  Imaging Review No results found.   MDM   1. Arm pain, diffuse, right        Billy Fischer, MD 02/12/15 1348

## 2015-02-12 NOTE — ED Notes (Signed)
Pt  Reports  Tingling   And pain   r   Arm     Since yesterday       Denys  Any  Recent  specfic  Injury         Cap  Refill  Intact

## 2015-11-14 ENCOUNTER — Encounter: Payer: Self-pay | Admitting: Women's Health

## 2015-11-14 ENCOUNTER — Ambulatory Visit (INDEPENDENT_AMBULATORY_CARE_PROVIDER_SITE_OTHER): Payer: 59 | Admitting: Women's Health

## 2015-11-14 VITALS — BP 138/80 | Ht 63.0 in | Wt 242.0 lb

## 2015-11-14 DIAGNOSIS — R1031 Right lower quadrant pain: Secondary | ICD-10-CM

## 2015-11-14 DIAGNOSIS — B3731 Acute candidiasis of vulva and vagina: Secondary | ICD-10-CM

## 2015-11-14 DIAGNOSIS — A499 Bacterial infection, unspecified: Secondary | ICD-10-CM | POA: Diagnosis not present

## 2015-11-14 DIAGNOSIS — B373 Candidiasis of vulva and vagina: Secondary | ICD-10-CM

## 2015-11-14 DIAGNOSIS — N760 Acute vaginitis: Secondary | ICD-10-CM | POA: Diagnosis not present

## 2015-11-14 DIAGNOSIS — B9689 Other specified bacterial agents as the cause of diseases classified elsewhere: Secondary | ICD-10-CM

## 2015-11-14 LAB — WET PREP FOR TRICH, YEAST, CLUE: TRICH WET PREP: NONE SEEN

## 2015-11-14 MED ORDER — FLUCONAZOLE 150 MG PO TABS: 150.0000 mg | ORAL_TABLET | Freq: Once | ORAL | Status: DC

## 2015-11-14 MED ORDER — METRONIDAZOLE 0.75 % VA GEL: VAGINAL | Status: DC

## 2015-11-14 NOTE — Progress Notes (Signed)
Patient ID: TEE NEVE, female   DOB: [birth year], 44 y.o.   MRN: [id number] Presents with complaint of right-sided low abdominal pain for the past week. Having mild nausea without vomiting,, scant vaginal discharge with mild irritation. Regular monthly cycle/BTL. Same partner. Denies urinary symptoms fever, constipation or change in routine.  Exam: Appears well. Abdomen obese, mild discomfort with deep palpation on the right lower quadrant, no rebound. External genitalia mild erythema at introitus, speculum exam moderate amount of a white discharge with odor noted, wet prep positive for many bacteria, few yeast. Bimanual no CMT or adnexal tenderness right-sided discomfort mid abdomen.   Bacteria vaginosis, yeast vaginitis Right lower quadrant pain  Plan: MetroGel vaginal cream 1 applicator at bedtime 5, alcohol precautions reviewed. Diflucan 150 by mouth 1 dose. Instructed to call if low abdominal pain persists we'll get an ultrasound.

## 2015-11-14 NOTE — Patient Instructions (Signed)

## 2015-12-08 ENCOUNTER — Emergency Department (HOSPITAL_COMMUNITY): Payer: 59

## 2015-12-08 ENCOUNTER — Emergency Department (HOSPITAL_COMMUNITY)
Admission: EM | Admit: 2015-12-08 | Discharge: 2015-12-08 | Disposition: A | Discharge status: Home / Self Care | Payer: 59 | Attending: Emergency Medicine | Admitting: Emergency Medicine

## 2015-12-08 ENCOUNTER — Encounter (HOSPITAL_COMMUNITY): Payer: Self-pay | Admitting: Emergency Medicine

## 2015-12-08 DIAGNOSIS — S99911A Unspecified injury of right ankle, initial encounter: Secondary | ICD-10-CM | POA: Insufficient documentation

## 2015-12-08 DIAGNOSIS — I10 Essential (primary) hypertension: Secondary | ICD-10-CM | POA: Diagnosis not present

## 2015-12-08 DIAGNOSIS — Y9289 Other specified places as the place of occurrence of the external cause: Secondary | ICD-10-CM | POA: Insufficient documentation

## 2015-12-08 DIAGNOSIS — Y9301 Activity, walking, marching and hiking: Secondary | ICD-10-CM | POA: Diagnosis not present

## 2015-12-08 DIAGNOSIS — W010XXA Fall on same level from slipping, tripping and stumbling without subsequent striking against object, initial encounter: Secondary | ICD-10-CM | POA: Insufficient documentation

## 2015-12-08 DIAGNOSIS — M25571 Pain in right ankle and joints of right foot: Secondary | ICD-10-CM

## 2015-12-08 DIAGNOSIS — Y998 Other external cause status: Secondary | ICD-10-CM | POA: Diagnosis not present

## 2015-12-08 DIAGNOSIS — S99921A Unspecified injury of right foot, initial encounter: Secondary | ICD-10-CM | POA: Diagnosis present

## 2015-12-08 MED ORDER — IBUPROFEN 200 MG PO TABS
200.0000 mg | ORAL_TABLET | Freq: Once | ORAL | Status: AC
  Administered 2015-12-08: 200 mg via ORAL
  Filled 2015-12-08: qty 1

## 2015-12-08 MED ORDER — IBUPROFEN 400 MG PO TABS
800.0000 mg | ORAL_TABLET | Freq: Once | ORAL | Status: AC
  Administered 2015-12-08: 600 mg via ORAL
  Filled 2015-12-08: qty 4

## 2015-12-08 MED ORDER — IBUPROFEN 800 MG PO TABS: 800.0000 mg | ORAL_TABLET | Freq: Three times a day (TID) | ORAL | Status: DC

## 2015-12-08 NOTE — ED Notes (Signed)
Pt son verbally abusive to staff during placement of ASO splint to RT foot. Pt's demanding to place ASO himself and using profanity to RN. Pt' son became aggressive when staff was instructing Pt on use of crutches. Staff requested son to wait in lobby till [place].

## 2015-12-08 NOTE — Discharge Instructions (Signed)
You are likely experiencing a sprained ankle. Your x-rays were reassuring and showed no broken bones or dislocations. You may use your ankle brace for comfort and while active. You may also use your crutches as needed. Take Motrin for your discomfort. Follow-up with your doctor as needed. Return to ED for any new or worsening symptoms as we discussed.  Ankle Pain Ankle pain is a common symptom. The bones, cartilage, tendons, and muscles of the ankle joint perform a lot of work each day. The ankle joint holds your body weight and allows you to move around. Ankle pain can occur on either side or back of 1 or both ankles. Ankle pain may be sharp and burning or dull and aching. There may be tenderness, stiffness, redness, or warmth around the ankle. The pain occurs more often when a person walks or puts pressure on the ankle. CAUSES  There are many reasons ankle pain can develop. It is important to work with your caregiver to identify the cause since many conditions can impact the bones, cartilage, muscles, and tendons. Causes for ankle pain include:  Injury, including a break (fracture), sprain, or strain often due to a fall, sports, or a high-impact activity.  Swelling (inflammation) of a tendon (tendonitis).  Achilles tendon rupture.  Ankle instability after repeated sprains and strains.  Poor foot alignment.  Pressure on a nerve (tarsal tunnel syndrome).  Arthritis in the ankle or the lining of the ankle.  Crystal formation in the ankle (gout or pseudogout). DIAGNOSIS  A diagnosis is based on your medical history, your symptoms, results of your physical exam, and results of diagnostic tests. Diagnostic tests may include X-ray exams or a computerized magnetic scan (magnetic resonance imaging, MRI). TREATMENT  Treatment will depend on the cause of your ankle pain and may include:  Keeping pressure off the ankle and limiting activities.  Using crutches or other walking support (a cane or  brace).  Using rest, ice, compression, and elevation.  Participating in physical therapy or home exercises.  Wearing shoe inserts or special shoes.  Losing weight.  Taking medications to reduce pain or swelling or receiving an injection.  Undergoing surgery. HOME CARE INSTRUCTIONS   Only take over-the-counter or prescription medicines for pain, discomfort, or fever as directed by your caregiver.  Put ice on the injured area.  Put ice in a plastic bag.  Place a towel between your skin and the bag.  Leave the ice on for 15-20 minutes at a time, 03-04 times a day.  Keep your leg raised (elevated) when possible to lessen swelling.  Avoid activities that cause ankle pain.  Follow specific exercises as directed by your caregiver.  Record how often you have ankle pain, the location of the pain, and what it feels like. This information may be helpful to you and your caregiver.  Ask your caregiver about returning to work or sports and whether you should drive.  Follow up with your caregiver for further examination, therapy, or testing as directed. SEEK MEDICAL CARE IF:   Pain or swelling continues or worsens beyond 1 week.  You have an oral temperature above 102 F (38.9 C).  You are feeling unwell or have chills.  You are having an increasingly difficult time with walking.  You have loss of sensation or other new symptoms.  You have questions or concerns. MAKE SURE YOU:   Understand these instructions.  Will watch your condition.  Will get help right away if you are not doing well  or get worse.   This information is not intended to replace advice given to you by your health care provider. Make sure you discuss any questions you have with your health care provider.   Document Released: 04/29/2010 Document Revised: 02/01/2012 Document Reviewed: 06/11/2015 Elsevier Interactive Patient Education [employer].

## 2015-12-08 NOTE — ED Provider Notes (Signed)
CSN: [id number]     Arrival date & time 12/08/15  0902 History  By signing my name below, I, Irene Pap, attest that this documentation has been prepared under the direction and in the presence of [employer], PA-C. Electronically Signed: Irene Pap, ED Scribe. 12/08/2015. 11:45 AM.   Chief Complaint  Patient presents with  . Foot Injury   The history is provided by the patient. No language interpreter was used.  HPI Comments: Jessica Contreras is a 45 y.o. Female with a hx of HTN who presents to the Emergency Department complaining of a right foot injury onset PTA. Pt states that she was walking her dog on wet grass when she slipped and landed on her foot; she states that her foot "folded" underneath her. She reports associated swelling and worsening pain with movement. She has not taken anything for her pain. Pt denies numbness or weakness.   Past Medical History  Diagnosis Date  . Hypertension    Past Surgical History  Procedure Laterality Date  . Tubal ligation      POST PARTUM   Family History  Problem Relation Age of Onset  . Cancer Maternal Aunt     LUNG  . Diabetes Maternal Grandmother   . Hypertension Maternal Grandmother   . Sarcoidosis Mother    Social History  Substance Use Topics  . Smoking status: Never Smoker   . Smokeless tobacco: Never Used     Comment: was only a social smoker  . Alcohol Use: 1.5 oz/week    3 drink(s) per week   OB History    Gravida Para Term Preterm AB TAB SAB Ectopic Multiple Living   3 2   1  1   2      Review of Systems 10 Systems reviewed and all are negative for acute change except as noted in the HPI.  Allergies  Review of patient's allergies indicates no known allergies.  Home Medications   Prior to Admission medications   Medication Sig Start Date End Date Taking? Authorizing Provider  fluconazole (DIFLUCAN) 150 MG tablet Take 1 tablet (150 mg total) by mouth once. 11/14/15   Huel Cote, NP  ibuprofen  (ADVIL,MOTRIN) 800 MG tablet Take 1 tablet (800 mg total) by mouth 3 (three) times daily. 12/08/15   Comer Locket, PA-C  metroNIDAZOLE (METROGEL VAGINAL) 0.75 % vaginal gel 1 applicator per vagina at HS x 5 11/14/15   Huel Cote, NP   BP 125/52 mmHg  Pulse 53  Temp(Src) 98.2 F (36.8 C)  Resp 18  Ht 5\' 4"  (1.626 m)  Wt 111.131 kg  BMI 42.03 kg/m2  SpO2 99%  LMP 11/11/2015 Physical Exam  Constitutional: She is oriented to person, place, and time. She appears well-developed and well-nourished.  HENT:  Head: Normocephalic and atraumatic.  Eyes: EOM are normal.  Neck: Normal range of motion. Neck supple.  Cardiovascular: Normal rate, regular rhythm and intact distal pulses.   Pulmonary/Chest: Effort normal and breath sounds normal.  Musculoskeletal: Normal range of motion.  Right foot: tenderness to dorsal aspect of mid foot; mild swelling; no ecchymosis or erythema; decreased ROM with dorsiflexion of right ankle; no tenderness to lateral or medial malleolus or base of 5th metatarsal; no other ecchymosis, lesions or deformities; right knee without tenderness, full active ROM; no tenderness at fibular head Muscle compartments soft  Neurological: She is alert and oriented to person, place, and time.  Motor strength is baseline. Sensation intact to light touch.  Skin:  Skin is warm and dry.  Psychiatric: She has a normal mood and affect. Her behavior is normal.  Nursing note and vitals reviewed.   ED Course  Procedures (including critical care time) DIAGNOSTIC STUDIES: Oxygen Saturation is 99% on RA, normal by my interpretation.    COORDINATION OF CARE: 10:23 AM-Discussed treatment plan which includes x-ray, ibuprofen and RICE techniques with pt at bedside and pt agreed to plan.   Labs Review Labs Reviewed - No data to display  Imaging Review Dg Foot Complete Right  12/08/2015  CLINICAL DATA:  Injury RIGHT foot while walking her dog, slipped, twisted her foot then landing on  her foot, swelling RIGHT foot, pain into leg, medial pain, history hypertension, initial encounter EXAM: RIGHT FOOT COMPLETE - 3+ VIEW COMPARISON:  None FINDINGS: Diffuse soft tissue swelling. Osseous mineralization normal. Joint spaces preserved. No definite fracture, dislocation, or bone destruction. IMPRESSION: Soft tissue swelling without definite acute bony abnormality. Electronically Signed   By: Lavonia Dana M.D.   On: 12/08/2015 11:42   I have personally reviewed and evaluated these images and lab results as part of my medical decision-making.  SPLINT APPLICATION Date/Time: 0000000 PM Authorized by: Verl Dicker Consent: Verbal consent obtained. Risks and benefits: risks, benefits and alternatives were discussed Consent given by: patient Splint applied by: orthopedic technician Location details: Right ankle  Splint type: ASO  Supplies used: ASO  Post-procedure: The splinted body part was neurovascularly unchanged following the procedure. Patient tolerance: Patient tolerated the procedure well with no immediate complications.      EKG Interpretation None     Filed Vitals:   12/08/15 1014  BP: 125/52  Pulse: 53  Temp: 98.2 F (36.8 C)  Resp: 18   Meds given in ED:  Medications  ibuprofen (ADVIL,MOTRIN) tablet 800 mg (600 mg Oral Given 12/08/15 1033)  ibuprofen (ADVIL,MOTRIN) tablet 200 mg (200 mg Oral Given 12/08/15 1120)    New Prescriptions   IBUPROFEN (ADVIL,MOTRIN) 800 MG TABLET    Take 1 tablet (800 mg total) by mouth 3 (three) times daily.    MDM  Patient with right ankle pain after mechanical fall. Patient X-Ray negative for obvious fracture or dislocation. Patient is neurovascularly intact. Pain managed in ED. Pt advised to follow up with orthopedics if symptoms persist for possibility of missed fracture diagnosis. Patient given brace and crutches while in ED, conservative therapy recommended and discussed. Patient will be dc home & is agreeable with above  plan.  Final diagnoses:  Ankle pain, right   I personally performed the services described in this documentation, which was scribed in my presence. The recorded information has been reviewed and is accurate.    Comer Locket, PA-C 12/08/15 1349  Milton Ferguson, MD 12/10/15 [phone]

## 2015-12-08 NOTE — ED Notes (Signed)
This EMT advised patients son that he could go bring the car around to emergency entrance and we could meet him in the front lobby. Patients son then stated " no I won't, I prefer to go with her so I can make sure she makes it out in one piece."

## 2015-12-08 NOTE — ED Notes (Signed)
Pt c/o injury to right foot while out walking her dog. Pt reports that she slipped, her foot twisted and she landing on her foot. Pt presents with swelling to right foot, reports pain extends into leg.

## 2015-12-08 NOTE — ED Notes (Signed)
Patient transported to X-ray 

## 2015-12-25 ENCOUNTER — Other Ambulatory Visit: Payer: Self-pay | Admitting: Family

## 2015-12-25 DIAGNOSIS — N63 Unspecified lump in unspecified breast: Secondary | ICD-10-CM

## 2015-12-30 ENCOUNTER — Encounter: Payer: Self-pay | Admitting: Gynecology

## 2016-01-08 ENCOUNTER — Other Ambulatory Visit: Payer: Self-pay | Admitting: Family

## 2016-01-08 ENCOUNTER — Ambulatory Visit
Admission: RE | Admit: 2016-01-08 | Discharge: 2016-01-08 | Disposition: A | Discharge status: Home / Self Care | Payer: 59 | Source: Ambulatory Visit | Attending: Family | Admitting: Family

## 2016-01-08 DIAGNOSIS — N63 Unspecified lump in unspecified breast: Secondary | ICD-10-CM

## 2016-01-27 ENCOUNTER — Encounter (HOSPITAL_COMMUNITY): Payer: Self-pay | Admitting: Emergency Medicine

## 2016-01-27 ENCOUNTER — Emergency Department (HOSPITAL_COMMUNITY)
Admission: EM | Admit: 2016-01-27 | Discharge: 2016-01-27 | Disposition: A | Discharge status: Home / Self Care | Payer: 59 | Source: Home / Self Care | Attending: Family Medicine | Admitting: Family Medicine

## 2016-01-27 DIAGNOSIS — J111 Influenza due to unidentified influenza virus with other respiratory manifestations: Secondary | ICD-10-CM | POA: Diagnosis not present

## 2016-01-27 DIAGNOSIS — N393 Stress incontinence (female) (male): Secondary | ICD-10-CM | POA: Diagnosis not present

## 2016-01-27 LAB — POCT URINALYSIS DIP (DEVICE)
BILIRUBIN URINE: NEGATIVE
GLUCOSE, UA: NEGATIVE mg/dL
HGB URINE DIPSTICK: NEGATIVE
Ketones, ur: NEGATIVE mg/dL
LEUKOCYTES UA: NEGATIVE
NITRITE: NEGATIVE
Protein, ur: NEGATIVE mg/dL
SPECIFIC GRAVITY, URINE: 1.025 (ref 1.005–1.030)
UROBILINOGEN UA: 0.2 mg/dL (ref 0.0–1.0)
pH: 6.5 (ref 5.0–8.0)

## 2016-01-27 MED ORDER — ALBUTEROL SULFATE HFA 108 (90 BASE) MCG/ACT IN AERS: 2.0000 | INHALATION_SPRAY | RESPIRATORY_TRACT | Status: DC | PRN

## 2016-01-27 NOTE — Discharge Instructions (Signed)

## 2016-01-27 NOTE — ED Provider Notes (Signed)
CSN: [id number]     Arrival date & time 01/27/16  1532 History   First MD Initiated Contact with Patient 01/27/16 1811     Chief Complaint  Patient presents with  . Influenza   (Consider location/radiation/quality/duration/timing/severity/associated sxs/prior Treatment) HPI Pt presents with sore throat, body aches, fever, chills for 5 days Home treatment has been OTC meds without much relief of symptoms Fever is improved for short periods of time with OTC antipyretics. Pain score is 4 mostly from coughing and body aches Taking fluids, no appetite No flu shot Has been exposed to others with similar symptoms.  Denies: CP, SOB, vomiting or diarrhea.  Past Medical History  Diagnosis Date  . Hypertension    Past Surgical History  Procedure Laterality Date  . Tubal ligation      POST PARTUM   Family History  Problem Relation Age of Onset  . Cancer Maternal Aunt     LUNG  . Diabetes Maternal Grandmother   . Hypertension Maternal Grandmother   . Sarcoidosis Mother    Social History  Substance Use Topics  . Smoking status: Never Smoker   . Smokeless tobacco: Never Used     Comment: was only a social smoker  . Alcohol Use: 1.5 oz/week    3 drink(s) per week   OB History    Gravida Para Term Preterm AB TAB SAB Ectopic Multiple Living   3 2   1  1   2      Review of Systems See hpi Allergies  Review of patient's allergies indicates no known allergies.  Home Medications   Prior to Admission medications   Medication Sig Start Date End Date Taking? Authorizing Provider  fluconazole (DIFLUCAN) 150 MG tablet Take 1 tablet (150 mg total) by mouth once. 11/14/15   Huel Cote, NP  ibuprofen (ADVIL,MOTRIN) 800 MG tablet Take 1 tablet (800 mg total) by mouth 3 (three) times daily. 12/08/15   Comer Locket, PA-C  metroNIDAZOLE (METROGEL VAGINAL) 0.75 % vaginal gel 1 applicator per vagina at HS x 5 11/14/15   Huel Cote, NP   Meds Ordered and Administered this Visit   Medications - No data to display  BP 118/64 mmHg  Pulse 84  Temp(Src) 100.1 F (37.8 C) (Oral)  SpO2 94%  LMP 01/16/2016 No data found.   Physical Exam NURSES NOTES AND VITAL SIGNS REVIEWED. CONSTITUTIONAL: Well developed, well nourished, no acute distress HEENT: normocephalic, atraumatic, right and left TM's are normal EYES: Conjunctiva normal NECK:normal ROM, supple, no adenopathy PULMONARY:No respiratory distress, normal effort, Lungs: few wheezes noted no increased work of breathing CARDIOVASCULAR: RRR, no murmur ABDOMEN: soft, ND, NT, +'ve BS MUSCULOSKELETAL: Normal ROM of all extremities,  SKIN: warm and dry without rash PSYCHIATRIC: Mood and affect, behavior are normal  ED Course  Procedures (including critical care time)  Labs Review Labs Reviewed  POCT URINALYSIS DIP (DEVICE)    Imaging Review No results found.   Visual Acuity Review  Right Eye Distance:   Left Eye Distance:   Bilateral Distance:    Right Eye Near:   Left Eye Near:    Bilateral Near:       UA is normal   MDM   1. Influenza   2. Stress incontinence in female    Patient is reassured that there are no issues that require transfer to higher level of care at this time.  Patient is advised to continue home symptomatic treatment. Prescription is sent to  pharmacy patient has  indicated.  Patient is advised that if there are new or worsening symptoms or attend the emergency department, or contact primary care provider. Instructions of care provided discharged home in stable condition. Return to work/school note provided.  THIS NOTE WAS GENERATED USING A VOICE RECOGNITION SOFTWARE PROGRAM. ALL REASONABLE EFFORTS  WERE MADE TO PROOFREAD THIS DOCUMENT FOR ACCURACY.     Konrad Felix, [place] 01/27/16 1910

## 2016-01-27 NOTE — ED Notes (Signed)
Pt here with flu-like sx's that started Saturday Sore throat, which has resolved, cough with slight clear phlegm, sob and now bilateral rib cage pain from coughing Body aches and fever reported as well Taking Ibuprofen 800 mg tab and cough meds

## 2016-02-13 ENCOUNTER — Encounter: Payer: Self-pay | Admitting: Gynecology

## 2016-03-26 ENCOUNTER — Ambulatory Visit (INDEPENDENT_AMBULATORY_CARE_PROVIDER_SITE_OTHER): Payer: 59 | Admitting: Gynecology

## 2016-03-26 ENCOUNTER — Encounter: Payer: Self-pay | Admitting: Gynecology

## 2016-03-26 VITALS — BP 118/76 | Ht 64.0 in | Wt 249.0 lb

## 2016-03-26 DIAGNOSIS — K219 Gastro-esophageal reflux disease without esophagitis: Secondary | ICD-10-CM

## 2016-03-26 DIAGNOSIS — Z01419 Encounter for gynecological examination (general) (routine) without abnormal findings: Secondary | ICD-10-CM | POA: Diagnosis not present

## 2016-03-26 NOTE — Progress Notes (Signed)
    Jessica Contreras 31-Jan-1971 [id number]        45 y.o.  [id number]  for annual exam.  Several issues noted below.  Past medical history,surgical history, problem list, medications, allergies, family history and social history were all reviewed and documented as reviewed in the EPIC chart.  ROS:  Performed with pertinent positives and negatives included in the history, assessment and plan.   Additional significant findings :  none   Exam: [name] assistant Filed Vitals:   03/26/16 1502  BP: 118/76  Height: 5\' 4"  (1.626 m)  Weight: 249 lb (112.946 kg)   General appearance:  Normal affect, orientation and appearance. Skin: Grossly normal HEENT: Without gross lesions.  No cervical or supraclavicular adenopathy. Thyroid normal.  Lungs:  Clear without wheezing, rales or rhonchi Cardiac: RR, without RMG Abdominal:  Soft, nontender, without masses, guarding, rebound, organomegaly or hernia Breasts:  Examined lying and sitting without masses, retractions, discharge or axillary adenopathy. Pelvic:  Ext/BUS/vagina normal  Cervix normal  Uterus anteverted, normal size, shape and contour, midline and mobile nontender   Adnexa without masses or tenderness    Anus and perineum normal   Rectovaginal normal sphincter tone without palpated masses or tenderness.    Assessment/Plan:  45 y.o. [id number] female for annual exam with regular menses, tubal sterilization.   1. GERD. Patient notes a lot of issues withreflux particularly when sleeping at night. Has tried a variety of OTC products without success. Recommend she follow up with gastroenterology for further evaluation and treatment. Names and numbers provided for her to call and schedule an appointment. 2. Mammography 12/2015. Continue with annual mammography when due. SBE monthly reviewed. 3. Pap smear 2013. Pap smear done today.  No history of abnormal Pap smears previously. 4. Health maintenance. No routine lab work done as patient relates  this will be done at her primary physician's office and she is going to set up an appointment. Follow up in one year, sooner as needed.   Anastasio Auerbach MD, 3:20 PM 03/26/2016

## 2016-03-26 NOTE — Patient Instructions (Signed)
[facility]   Address: [address], [place], [place] [address]  [phone]    or  [facility]  Address: [address], [place], [place] [address]  [phone]    You may obtain a copy of any labs that were done today by logging onto MyChart as outlined in the instructions provided with your AVS (after visit summary). The office will not call with normal lab results but certainly if there are any significant abnormalities then we will contact you.   Health Maintenance Adopting a healthy lifestyle and getting preventive care can go a long way to promote health and wellness. Talk with your health care provider about what schedule of regular examinations is right for you. This is a good chance for you to check in with your provider about disease prevention and staying healthy. In between checkups, there are plenty of things you can do on your own. Experts have done a lot of research about which lifestyle changes and preventive measures are most likely to keep you healthy. Ask your health care provider for more information. WEIGHT AND DIET  Eat a healthy diet  Be sure to include plenty of vegetables, fruits, low-fat dairy products, and lean protein.  Do not eat a lot of foods high in solid fats, added sugars, or salt.  Get regular exercise. This is one of the most important things you can do for your health.  Most adults should exercise for at least 150 minutes each week. The exercise should increase your heart rate and make you sweat (moderate-intensity exercise).  Most adults should also do strengthening exercises at least twice a week. This is in addition to the moderate-intensity exercise.  Maintain a healthy weight  Body mass index (BMI) is a measurement that can be used to identify possible weight problems. It estimates body fat based on height and weight. Your health care provider can help determine your BMI and help you achieve or maintain  a healthy weight.  For females 48 years of age and older:   A BMI below 18.5 is considered underweight.  A BMI of 18.5 to 24.9 is normal.  A BMI of 25 to 29.9 is considered overweight.  A BMI of 30 and above is considered obese.  Watch levels of cholesterol and blood lipids  You should start having your blood tested for lipids and cholesterol at 44 years of age, then have this test every 5 years.  You may need to have your cholesterol levels checked more often if:  Your lipid or cholesterol levels are high.  You are older than 44 years of age.  You are at high risk for heart disease.  CANCER SCREENING   Lung Cancer  Lung cancer screening is recommended for adults 86-52 years old who are at high risk for lung cancer because of a history of smoking.  A yearly low-dose CT scan of the lungs is recommended for people who:  Currently smoke.  Have quit within the past 15 years.  Have at least a 30-pack-year history of smoking. A pack year is smoking an average of one pack of cigarettes a day for 1 year.  Yearly screening should continue until it has been 15 years since you quit.  Yearly screening should stop if you develop a health problem that would prevent you from having lung cancer treatment.  Breast Cancer  Practice breast self-awareness. This means understanding how your breasts normally appear and feel.  It also means doing regular  breast self-exams. Let your health care provider know about any changes, no matter how small.  If you are in your 20s or 30s, you should have a clinical breast exam (CBE) by a health care provider every 1-3 years as part of a regular health exam.  If you are 68 or older, have a CBE every year. Also consider having a breast X-ray (mammogram) every year.  If you have a family history of breast cancer, talk to your health care provider about genetic screening.  If you are at high risk for breast cancer, talk to your health care provider  about having an MRI and a mammogram every year.  Breast cancer gene (BRCA) assessment is recommended for women who have family members with BRCA-related cancers. BRCA-related cancers include:  Breast.  Ovarian.  Tubal.  Peritoneal cancers.  Results of the assessment will determine the need for genetic counseling and BRCA1 and BRCA2 testing. Cervical Cancer Routine pelvic examinations to screen for cervical cancer are no longer recommended for nonpregnant women who are considered low risk for cancer of the pelvic organs (ovaries, uterus, and vagina) and who do not have symptoms. A pelvic examination may be necessary if you have symptoms including those associated with pelvic infections. Ask your health care provider if a screening pelvic exam is right for you.   The Pap test is the screening test for cervical cancer for women who are considered at risk.  If you had a hysterectomy for a problem that was not cancer or a condition that could lead to cancer, then you no longer need Pap tests.  If you are older than 65 years, and you have had normal Pap tests for the past 10 years, you no longer need to have Pap tests.  If you have had past treatment for cervical cancer or a condition that could lead to cancer, you need Pap tests and screening for cancer for at least 20 years after your treatment.  If you no longer get a Pap test, assess your risk factors if they change (such as having a new sexual partner). This can affect whether you should start being screened again.  Some women have medical problems that increase their chance of getting cervical cancer. If this is the case for you, your health care provider may recommend more frequent screening and Pap tests.  The human papillomavirus (HPV) test is another test that may be used for cervical cancer screening. The HPV test looks for the virus that can cause cell changes in the cervix. The cells collected during the Pap test can be tested for  HPV.  The HPV test can be used to screen women 57 years of age and older. Getting tested for HPV can extend the interval between normal Pap tests from three to five years.  An HPV test also should be used to screen women of any age who have unclear Pap test results.  After 44 years of age, women should have HPV testing as often as Pap tests.  Colorectal Cancer  This type of cancer can be detected and often prevented.  Routine colorectal cancer screening usually begins at 44 years of age and continues through 44 years of age.  Your health care provider may recommend screening at an earlier age if you have risk factors for colon cancer.  Your health care provider may also recommend using home test kits to check for hidden blood in the stool.  A small camera at the end of a tube can  be used to examine your colon directly (sigmoidoscopy or colonoscopy). This is done to check for the earliest forms of colorectal cancer.  Routine screening usually begins at age 31.  Direct examination of the colon should be repeated every 5-10 years through 44 years of age. However, you may need to be screened more often if early forms of precancerous polyps or small growths are found. Skin Cancer  Check your skin from head to toe regularly.  Tell your health care provider about any new moles or changes in moles, especially if there is a change in a mole's Contreras or color.  Also tell your health care provider if you have a mole that is larger than the size of a pencil eraser.  Always use sunscreen. Apply sunscreen liberally and repeatedly throughout the day.  Protect yourself by wearing long sleeves, pants, a wide-brimmed hat, and sunglasses whenever you are outside. HEART DISEASE, DIABETES, AND HIGH BLOOD PRESSURE   Have your blood pressure checked at least every 1-2 years. High blood pressure causes heart disease and increases the risk of stroke.  If you are between 10 years and 3 years old, ask  your health care provider if you should take aspirin to prevent strokes.  Have regular diabetes screenings. This involves taking a blood sample to check your fasting blood sugar level.  If you are at a normal weight and have a low risk for diabetes, have this test once every three years after 44 years of age.  If you are overweight and have a high risk for diabetes, consider being tested at a younger age or more often. PREVENTING INFECTION  Hepatitis B  If you have a higher risk for hepatitis B, you should be screened for this virus. You are considered at high risk for hepatitis B if:  You were born in a country where hepatitis B is common. Ask your health care provider which countries are considered high risk.  Your parents were born in a high-risk country, and you have not been immunized against hepatitis B (hepatitis B vaccine).  You have HIV or AIDS.  You use needles to inject street drugs.  You live with someone who has hepatitis B.  You have had sex with someone who has hepatitis B.  You get hemodialysis treatment.  You take certain medicines for conditions, including cancer, organ transplantation, and autoimmune conditions. Hepatitis C  Blood testing is recommended for:  Everyone born from [birth year] through 1964.  Anyone with known risk factors for hepatitis C. Sexually transmitted infections (STIs)  You should be screened for sexually transmitted infections (STIs) including gonorrhea and chlamydia if:  You are sexually active and are younger than 44 years of age.  You are older than 44 years of age and your health care provider tells you that you are at risk for this type of infection.  Your sexual activity has changed since you were last screened and you are at an increased risk for chlamydia or gonorrhea. Ask your health care provider if you are at risk.  If you do not have HIV, but are at risk, it may be recommended that you take a prescription medicine daily to  prevent HIV infection. This is called pre-exposure prophylaxis (PrEP). You are considered at risk if:  You are sexually active and do not regularly use condoms or know the HIV status of your partner(s).  You take drugs by injection.  You are sexually active with a partner who has HIV. Talk with your health  care provider about whether you are at high risk of being infected with HIV. If you choose to begin PrEP, you should first be tested for HIV. You should then be tested every 3 months for as long as you are taking PrEP.  PREGNANCY   If you are premenopausal and you may become pregnant, ask your health care provider about preconception counseling.  If you may become pregnant, take 400 to 800 micrograms (mcg) of folic acid every day.  If you want to prevent pregnancy, talk to your health care provider about birth control (contraception). OSTEOPOROSIS AND MENOPAUSE   Osteoporosis is a disease in which the bones lose minerals and strength with aging. This can result in serious bone fractures. Your risk for osteoporosis can be identified using a bone density scan.  If you are 69 years of age or older, or if you are at risk for osteoporosis and fractures, ask your health care provider if you should be screened.  Ask your health care provider whether you should take a calcium or vitamin D supplement to lower your risk for osteoporosis.  Menopause may have certain physical symptoms and risks.  Hormone replacement therapy may reduce some of these symptoms and risks. Talk to your health care provider about whether hormone replacement therapy is right for you.  HOME CARE INSTRUCTIONS   Schedule regular health, dental, and eye exams.  Stay current with your immunizations.   Do not use any tobacco products including cigarettes, chewing tobacco, or electronic cigarettes.  If you are pregnant, do not drink alcohol.  If you are breastfeeding, limit how much and how often you drink  alcohol.  Limit alcohol intake to no more than 1 drink per day for nonpregnant women. One drink equals 12 ounces of beer, 5 ounces of wine, or 1 ounces of hard liquor.  Do not use street drugs.  Do not share needles.  Ask your health care provider for help if you need support or information about quitting drugs.  Tell your health care provider if you often feel depressed.  Tell your health care provider if you have ever been abused or do not feel safe at home. Document Released: 05/25/2011 Document Revised: 03/26/2014 Document Reviewed: 10/11/2013 [facility] Patient Information 2015 [place], [place]. This information is not intended to replace advice given to you by your health care provider. Make sure you discuss any questions you have with your health care provider.

## 2016-03-26 NOTE — Addendum Note (Signed)
Addended by: Nelva Nay on: 03/26/2016 03:30 PM   Modules accepted: Orders, SmartSet

## 2016-03-27 LAB — PAP IG W/ RFLX HPV ASCU

## 2016-11-23 DIAGNOSIS — M109 Gout, unspecified: Secondary | ICD-10-CM

## 2016-11-23 HISTORY — DX: Gout, unspecified: M10.9

## 2016-12-02 ENCOUNTER — Other Ambulatory Visit: Payer: Self-pay | Admitting: Family

## 2016-12-02 DIAGNOSIS — N6310 Unspecified lump in the right breast, unspecified quadrant: Secondary | ICD-10-CM

## 2017-01-13 ENCOUNTER — Ambulatory Visit
Admission: RE | Admit: 2017-01-13 | Discharge: 2017-01-13 | Disposition: A | Discharge status: Home / Self Care | Payer: 59 | Source: Ambulatory Visit | Attending: Family | Admitting: Family

## 2017-01-13 DIAGNOSIS — N6310 Unspecified lump in the right breast, unspecified quadrant: Secondary | ICD-10-CM

## 2017-09-02 ENCOUNTER — Ambulatory Visit (INDEPENDENT_AMBULATORY_CARE_PROVIDER_SITE_OTHER): Payer: 59 | Admitting: Gynecology

## 2017-09-02 ENCOUNTER — Encounter: Payer: Self-pay | Admitting: Gynecology

## 2017-09-02 VITALS — BP 124/80

## 2017-09-02 DIAGNOSIS — B373 Candidiasis of vulva and vagina: Secondary | ICD-10-CM

## 2017-09-02 DIAGNOSIS — R300 Dysuria: Secondary | ICD-10-CM | POA: Diagnosis not present

## 2017-09-02 DIAGNOSIS — N760 Acute vaginitis: Secondary | ICD-10-CM | POA: Diagnosis not present

## 2017-09-02 DIAGNOSIS — A5901 Trichomonal vulvovaginitis: Secondary | ICD-10-CM | POA: Diagnosis not present

## 2017-09-02 DIAGNOSIS — B3731 Acute candidiasis of vulva and vagina: Secondary | ICD-10-CM

## 2017-09-02 DIAGNOSIS — B9689 Other specified bacterial agents as the cause of diseases classified elsewhere: Secondary | ICD-10-CM

## 2017-09-02 LAB — WET PREP FOR TRICH, YEAST, CLUE

## 2017-09-02 MED ORDER — FLUCONAZOLE 150 MG PO TABS: 150.0000 mg | ORAL_TABLET | Freq: Once | ORAL | 0 refills | Status: AC

## 2017-09-02 MED ORDER — METRONIDAZOLE 500 MG PO TABS: 500.0000 mg | ORAL_TABLET | Freq: Two times a day (BID) | ORAL | 0 refills | Status: DC

## 2017-09-02 NOTE — Patient Instructions (Signed)
Take the flagyl medication twice daily for 7 days.  Avoid alcohol while taking  Take the diflucan pill once  Sexual partners need to be treated for Trichomonas   Trichomoniasis Trichomoniasis is an STI (sexually transmitted infection) that can affect both women and men. In women, the outer area of the female genitalia (vulva) and the vagina are affected. In men, the penis is mainly affected, but the prostate and other reproductive organs can also be involved. This condition can be treated with medicine. It often has no symptoms (is asymptomatic), especially in men. What are the causes? This condition is caused by an organism called Trichomonas vaginalis. Trichomoniasis most often spreads from person to person (is contagious) through sexual contact. What increases the risk? The following factors may make you more likely to develop this condition:  Having unprotected sexual intercourse.  Having sexual intercourse with a partner who has trichomoniasis.  Having multiple sexual partners.  Having had previous trichomoniasis infections or other STIs.  What are the signs or symptoms? In women, symptoms of trichomoniasis include:  Abnormal vaginal discharge that is clear, white, gray, or yellow-green and foamy and has an unusual "fishy" odor.  Itching and irritation of the vagina and vulva.  Burning or pain during urination or sexual intercourse.  Genital redness and swelling.  In men, symptoms of trichomoniasis include:  Penile discharge that may be foamy or contain pus.  Pain in the penis. This may happen only when urinating.  Itching or irritation inside the penis.  Burning after urination or ejaculation.  How is this diagnosed? In women, this condition may be found during a routine Pap test or physical exam. It may be found in men during a routine physical exam. Your health care provider may perform tests to help diagnose this infection, such as:  Urine tests (men and  women).  The following in women: ? Testing the pH of the vagina. ? A vaginal swab test that checks for the Trichomonas vaginalis organism. ? Testing vaginal secretions.  Your health care provider may test you for other STIs, including HIV (human immunodeficiency virus). How is this treated? This condition is treated with medicine taken by mouth (orally), such as metronidazole or tinidazole to fight the infection. Your sexual partner(s) may also need to be tested and treated.  If you are a woman and you plan to become pregnant or think you may be pregnant, tell your health care provider right away. Some medicines that are used to treat the infection should not be taken during pregnancy.  Your health care provider may recommend over-the-counter medicines or creams to help relieve itching or irritation. You may be tested for infection again 3 months after treatment. Follow these instructions at home:  Take and use over-the-counter and prescription medicines, including creams, only as told by your health care provider.  Do not have sexual intercourse until one week after you finish your medicine, or until your health care provider approves. Ask your health care provider when you may resume sexual intercourse.  (Women) Do not douche or wear tampons while you have the infection.  Discuss your infection with your sexual partner(s). Make sure that your partner gets tested and treated, if necessary.  Keep all follow-up visits as told by your health care provider. This is important. How is this prevented?  Use condoms every time you have sex. Using condoms correctly and consistently can help protect against STIs.  Avoid having multiple sexual partners.  Talk with your sexual partner about any  symptoms that either of you may have, as well as any history of STIs.  Get tested for STIs and STDs (sexually transmitted diseases) before you have sex. Ask your partner to do the same.  Do not have  sexual contact if you have symptoms of trichomoniasis or another STI. Contact a health care provider if:  You still have symptoms after you finish your medicine.  You develop pain in your abdomen.  You have pain when you urinate.  You have bleeding after sexual intercourse.  You develop a rash.  You feel nauseous or you vomit.  You plan to become pregnant or think you may be pregnant. Summary  Trichomoniasis is an STI (sexually transmitted infection) that can affect both women and men.  This condition often has no symptoms (is asymptomatic), especially in men.  You should not have sexual intercourse until one week after you finish your medicine, or until your health care provider approves. Ask your health care provider when you may resume sexual intercourse.  Discuss your infection with your sexual partner. Make sure that your partner gets tested and treated, if necessary. This information is not intended to replace advice given to you by your health care provider. Make sure you discuss any questions you have with your health care provider. Document Released: 05/05/2001 Document Revised: 10/02/2016 Document Reviewed: 10/02/2016 Elsevier Interactive Patient Education  2017 [employer].

## 2017-09-02 NOTE — Progress Notes (Signed)
    [facility] 04-Apr-1971 [id number]        46 y.o.  [id number] presents complaining of vaginal discharge with irritation and itching over the last week or so. Also some discoloration in her urine as far as darker. No frequency urgency low back pain fever or chills. No nausea vomiting diarrhea constipation.  Past medical history,surgical history, problem list, medications, allergies, family history and social history were all reviewed and documented in the EPIC chart.  Directed ROS with pertinent positives and negatives documented in the history of present illness/assessment and plan.  Exam: [name] assistant Vitals:   09/02/17 1147  BP: 124/80   General appearance:  Normal Spine straight without CVA tenderness Abdomen soft nontender without masses guarding rebound Helical external BUS vagina with yellowish frothy discharge. Cervix normal. Uterus grossly normal size midline mobile nontender. Adnexa without masses or tenderness.  Assessment/Plan:  46 y.o. [id number] with history and exam as above. Wet prep positive for yeast, trichomonas and bacterial vaginosis. Urinalysis appears contaminated. Discussed her yeast, Trichomonas and bacterial vaginosis with her. STD nature of trichomonas reviewed. GC/chlamydia screen also done. Need for partner evaluation and treatment was reviewed.  Will treat with Flagyl 500 mg twice a day 7 days. Alcohol voidance reviewed. Diflucan 150 mg 1 dose. Follow up if symptoms persist, worsen or recur.    Anastasio Auerbach MD, 12:19 PM 09/02/2017

## 2017-09-02 NOTE — Addendum Note (Signed)
Addended by: Nelva Nay on: 09/02/2017 12:27 PM   Modules accepted: Orders

## 2017-09-03 LAB — C. TRACHOMATIS/N. GONORRHOEAE RNA
C. trachomatis RNA, TMA: NOT DETECTED
N. gonorrhoeae RNA, TMA: NOT DETECTED

## 2017-09-04 LAB — URINALYSIS W MICROSCOPIC + REFLEX CULTURE
Bilirubin Urine: NEGATIVE
Glucose, UA: NEGATIVE
HYALINE CAST: NONE SEEN /LPF
Ketones, ur: NEGATIVE
Nitrites, Initial: NEGATIVE
PROTEIN: NEGATIVE
Specific Gravity, Urine: 1.022 (ref 1.001–1.03)
pH: 5 (ref 5.0–8.0)

## 2017-09-04 LAB — URINE CULTURE
MICRO NUMBER: 81139756
SPECIMEN QUALITY: ADEQUATE

## 2017-09-04 LAB — CULTURE INDICATED

## 2017-09-10 ENCOUNTER — Encounter (HOSPITAL_COMMUNITY): Payer: Self-pay | Admitting: Family Medicine

## 2017-09-10 ENCOUNTER — Ambulatory Visit (HOSPITAL_COMMUNITY)
Admission: EM | Admit: 2017-09-10 | Discharge: 2017-09-10 | Disposition: A | Discharge status: Home / Self Care | Payer: 59 | Attending: Emergency Medicine | Admitting: Emergency Medicine

## 2017-09-10 DIAGNOSIS — M5431 Sciatica, right side: Secondary | ICD-10-CM

## 2017-09-10 DIAGNOSIS — M5441 Lumbago with sciatica, right side: Secondary | ICD-10-CM

## 2017-09-10 MED ORDER — KETOROLAC TROMETHAMINE 30 MG/ML IJ SOLN
INTRAMUSCULAR | Status: AC
  Filled 2017-09-10: qty 1

## 2017-09-10 MED ORDER — KETOROLAC TROMETHAMINE 30 MG/ML IJ SOLN
30.0000 mg | Freq: Once | INTRAMUSCULAR | Status: AC
  Administered 2017-09-10: 30 mg via INTRAMUSCULAR

## 2017-09-10 MED ORDER — NAPROXEN 500 MG PO TABS: 500.0000 mg | ORAL_TABLET | Freq: Two times a day (BID) | ORAL | 0 refills | Status: DC

## 2017-09-10 MED ORDER — PREDNISONE 10 MG (21) PO TBPK: ORAL_TABLET | Freq: Every day | ORAL | 0 refills | Status: DC

## 2017-09-10 NOTE — ED Provider Notes (Signed)
[facility]    CSN: [id number] Arrival date & time: 09/10/17  1722     History   Chief Complaint Chief Complaint  Patient presents with  . Back Pain    HPI Jessica Contreras is a 46 y.o. female.   Pt here for pain that begin in lower back and radates down rt leg since this am. States that she has not had an injury, pain is more with standing. No edema or calf pain, no sob, no loss of bowels. She has been told that she had arthritis in her back many years ago. Has not taken anything for this pta.       Past Medical History:  Diagnosis Date  . Arthritis   . Gout   . Hypertension     Patient Active Problem List   Diagnosis Date Noted  . LOM 04/21/2008  . CONTACT DERMATITIS&OTHER ECZEMA DUE TO PLANTS 04/21/2008    Past Surgical History:  Procedure Laterality Date  . TUBAL LIGATION     POST PARTUM    OB History    Gravida Para Term Preterm AB Living   3 2     1 2    SAB TAB Ectopic Multiple Live Births   1               Home Medications    Prior to Admission medications   Medication Sig Start Date End Date Taking? Authorizing Provider  albuterol (PROVENTIL HFA;VENTOLIN HFA) 108 (90 Base) MCG/ACT inhaler Inhale 2 puffs into the lungs every 4 (four) hours as needed for wheezing or shortness of breath. 01/27/16   Konrad Felix, PA  ibuprofen (ADVIL,MOTRIN) 800 MG tablet Take 1 tablet (800 mg total) by mouth 3 (three) times daily. 12/08/15   Cartner, Marland Kitchen, PA-C  metroNIDAZOLE (FLAGYL) 500 MG tablet Take 1 tablet (500 mg total) by mouth 2 (two) times daily. For 7 days.  Avoid alcohol while taking 09/02/17   Fontaine, Belinda Block, MD  naproxen (NAPROSYN) 500 MG tablet Take 1 tablet (500 mg total) by mouth 2 (two) times daily. 09/10/17   Marney Setting, NP  predniSONE (STERAPRED UNI-PAK 21 TAB) 10 MG (21) TBPK tablet Take by mouth daily. Take 6 tabs by mouth daily  for 2 days, then 5 tabs for 2 days, then 4 tabs for 2 days, then 3 tabs for 2 days, 2  tabs for 2 days, then 1 tab by mouth daily for 2 days 09/10/17   Marney Setting, NP    Family History Family History  Problem Relation Age of Onset  . Cancer Maternal Aunt        LUNG  . Diabetes Maternal Grandmother   . Hypertension Maternal Grandmother   . Sarcoidosis Mother     Social History Social History  Substance Use Topics  . Smoking status: Never Smoker  . Smokeless tobacco: Never Used     Comment: was only a social smoker  . Alcohol use 0.0 oz/week     Comment: Rare     Allergies   Patient has no known allergies.   Review of Systems Review of Systems  Constitutional: Negative.   Respiratory: Negative.   Cardiovascular: Negative.   Gastrointestinal: Negative.   Genitourinary: Negative.   Musculoskeletal: Positive for back pain.       Radates to rt leg down buttocks area   Skin: Negative.   Neurological: Negative.      Physical Exam Triage Vital Signs ED Triage Vitals  Enc  Vitals Group     BP 09/10/17 1737 129/73     Pulse Rate 09/10/17 1737 65     Resp 09/10/17 1737 18     Temp 09/10/17 1737 98.5 F (36.9 C)     Temp src --      SpO2 09/10/17 1737 100 %     Weight --      Height --      Head Circumference --      Peak Flow --      Pain Score 09/10/17 1737 10     Pain Loc --      Pain Edu? --      Excl. in [place]? --    No data found.   Updated Vital Signs BP 129/73   Pulse 65   Temp 98.5 F (36.9 C)   Resp 18   LMP 03/15/2016   SpO2 100%   Visual Acuity     Physical Exam  Constitutional: She appears well-developed.  Cardiovascular: Normal rate.   Pulmonary/Chest: Effort normal and breath sounds normal.  Abdominal: Soft. Bowel sounds are normal.  Musculoskeletal: She exhibits tenderness.  Pain to lower back mid, radates to rt leg able to flex and extend leg. Pain with standing. No swelling to calfs, strong pulses,   Neurological: She is alert.  Skin: Skin is warm. Capillary refill takes less than 2 seconds.     UC  Treatments / Results  Labs (all labs ordered are listed, but only abnormal results are displayed) Labs Reviewed - No data to display  EKG  EKG Interpretation None       Radiology No results found.  Procedures Procedures (including critical care time)  Medications Ordered in UC Medications  ketorolac (TORADOL) 30 MG/ML injection 30 mg (not administered)     Initial Impression / Assessment and Plan / UC Course  I have reviewed the triage vital signs and the nursing notes.  Pertinent labs & imaging results that were available during my care of the patient were reviewed by me and considered in my medical decision making (see chart for details).     We will give you a shot in the office to help with pain and inflammation  Take pain meds as needed , they may make you sleepy so you can take motrin or tylenol as needed You will need to follow up with ortho if the symptoms are not any better   Final Clinical Impressions(s) / UC Diagnoses   Final diagnoses:  Acute right-sided low back pain with right-sided sciatica  Sciatica of right side    New Prescriptions New Prescriptions   NAPROXEN (NAPROSYN) 500 MG TABLET    Take 1 tablet (500 mg total) by mouth 2 (two) times daily.   PREDNISONE (STERAPRED UNI-PAK 21 TAB) 10 MG (21) TBPK TABLET    Take by mouth daily. Take 6 tabs by mouth daily  for 2 days, then 5 tabs for 2 days, then 4 tabs for 2 days, then 3 tabs for 2 days, 2 tabs for 2 days, then 1 tab by mouth daily for 2 days     Controlled Substance Prescriptions [place] Controlled Substance Registry consulted? Not Applicable   Marney Setting, NP 09/10/17 [phone]

## 2017-09-10 NOTE — Discharge Instructions (Signed)
We will give you a shot in the office to help with pain and inflammation  Take pain meds as needed , they may make you sleepy so you can take motrin or tylenol as needed You will need to follow up with ortho if the symptoms are not any better

## 2017-09-10 NOTE — ED Triage Notes (Signed)
Pt here for right sided lower back pain with radiation down right leg since this am. Denies injury,

## 2018-01-03 ENCOUNTER — Ambulatory Visit: Payer: 59 | Admitting: Gynecology

## 2018-01-04 ENCOUNTER — Ambulatory Visit (INDEPENDENT_AMBULATORY_CARE_PROVIDER_SITE_OTHER): Payer: 59 | Admitting: Gynecology

## 2018-01-04 ENCOUNTER — Encounter: Payer: Self-pay | Admitting: Gynecology

## 2018-01-04 VITALS — BP 122/78

## 2018-01-04 DIAGNOSIS — N760 Acute vaginitis: Secondary | ICD-10-CM | POA: Diagnosis not present

## 2018-01-04 DIAGNOSIS — R350 Frequency of micturition: Secondary | ICD-10-CM

## 2018-01-04 DIAGNOSIS — B373 Candidiasis of vulva and vagina: Secondary | ICD-10-CM

## 2018-01-04 DIAGNOSIS — B9689 Other specified bacterial agents as the cause of diseases classified elsewhere: Secondary | ICD-10-CM | POA: Diagnosis not present

## 2018-01-04 DIAGNOSIS — N898 Other specified noninflammatory disorders of vagina: Secondary | ICD-10-CM | POA: Diagnosis not present

## 2018-01-04 DIAGNOSIS — B3731 Acute candidiasis of vulva and vagina: Secondary | ICD-10-CM

## 2018-01-04 LAB — WET PREP FOR TRICH, YEAST, CLUE

## 2018-01-04 MED ORDER — TERCONAZOLE 0.4 % VA CREA: 1.0000 | TOPICAL_CREAM | Freq: Every day | VAGINAL | 0 refills | Status: DC

## 2018-01-04 MED ORDER — METRONIDAZOLE 500 MG PO TABS: 500.0000 mg | ORAL_TABLET | Freq: Two times a day (BID) | ORAL | 0 refills | Status: DC

## 2018-01-04 NOTE — Progress Notes (Signed)
    Jessica Contreras Oct 07, 1971 [id number]        47 y.o.  [id number] presents complaining of vaginal itching and irritation with odor.  Also notes some urinary frequency.  No dysuria urgency low back pain fever or chills.  Was treated for bacterial vaginosis, yeast and trichomonas in October.  Notes that her symptoms resolved but then returned and she is noticed this irritation of the last month or 2.  Had a negative GC/Chlamydia screen also in October.  Past medical history,surgical history, problem list, medications, allergies, family history and social history were all reviewed and documented in the EPIC chart.  Directed ROS with pertinent positives and negatives documented in the history of present illness/assessment and plan.  Exam: With [name] assistant Vitals:   01/04/18 1022  BP: 122/78   General appearance:  Normal Abdomen soft nontender without masses guarding rebound Pelvic external BUS vagina with thick cottage cheese discharge.  Cervix normal.  Uterus normal size midline mobile.  Adnexa without masses or tenderness  Assessment/Plan:  47 y.o. [id number] with history and exam as above.  Wet prep is positive for yeast and bacterial vaginosis.  No trichomonas.  Will treat with Terazol 7 day cream to treat the yeast.  Flagyl 500 mg twice daily times 7 days, alcohol avoidance reviewed to treat the bacterial vaginosis.  Urine analysis does show some yeast and is contaminated with squamous cells.  Will culture for completeness and treat if bacteria grow out.  Otherwise patient will follow-up if her symptoms persist, worsen or recur.    Anastasio Auerbach MD, 10:30 AM 01/04/2018

## 2018-01-04 NOTE — Patient Instructions (Signed)
Take the Flagyl medication twice daily for 7 days.  Avoid alcohol while taking.  Use the Terazol vaginal cream nightly for 7 nights to treat the yeast infection.  Follow-up if your symptoms persist, worsen or recur.

## 2018-01-12 LAB — URINALYSIS, COMPLETE W/RFL CULTURE
BILIRUBIN URINE: NEGATIVE
Glucose, UA: NEGATIVE
Hgb urine dipstick: NEGATIVE
Hyaline Cast: NONE SEEN /LPF
Ketones, ur: NEGATIVE
LEUKOCYTE ESTERASE: NEGATIVE
NITRITES URINE, INITIAL: NEGATIVE
PH: 6 (ref 5.0–8.0)
Protein, ur: NEGATIVE
RBC / HPF: NONE SEEN /HPF (ref 0–2)
SPECIFIC GRAVITY, URINE: 1.02 (ref 1.001–1.03)

## 2018-01-12 LAB — URINE CULTURE

## 2018-01-12 LAB — CULTURE INDICATED

## 2018-02-24 ENCOUNTER — Ambulatory Visit: Payer: 59 | Admitting: Gynecology

## 2018-02-24 ENCOUNTER — Encounter: Payer: Self-pay | Admitting: Gynecology

## 2018-02-24 VITALS — BP 118/78 | Ht 64.0 in | Wt 239.0 lb

## 2018-02-24 DIAGNOSIS — Z01419 Encounter for gynecological examination (general) (routine) without abnormal findings: Secondary | ICD-10-CM | POA: Diagnosis not present

## 2018-02-24 NOTE — Progress Notes (Signed)
    Jessica Contreras 05/18/1971 [id number]        47 y.o.  [id number] for annual gynecologic exam.  Doing well without gynecologic complaints  Past medical history,surgical history, problem list, medications, allergies, family history and social history were all reviewed and documented as reviewed in the EPIC chart.  ROS:  Performed with pertinent positives and negatives included in the history, assessment and plan.   Additional significant findings : None   Exam: Jessica Contreras assistant Vitals:   02/24/18 1135  BP: 118/78  Weight: 239 lb (108.4 kg)  Height: 5\' 4"  (1.626 m)   Body mass index is 41.02 kg/m.  General appearance:  Normal affect, orientation and appearance. Skin: Grossly normal HEENT: Without gross lesions.  No cervical or supraclavicular adenopathy. Thyroid normal.  Lungs:  Clear without wheezing, rales or rhonchi Cardiac: RR, without RMG Abdominal:  Soft, nontender, without masses, guarding, rebound, organomegaly or hernia Breasts:  Examined lying and sitting without masses, retractions, discharge or axillary adenopathy. Pelvic:  Ext, BUS, Vagina: Normal  Cervix: Normal  Uterus: Anteverted, normal size, shape and contour, midline and mobile nontender   Adnexa: Without masses or tenderness    Anus and perineum: Normal   Rectovaginal: Normal sphincter tone without palpated masses or tenderness.    Assessment/Plan:  47 y.o. [id number] female for annual gynecologic exam with regular menses, tubal sterilization.   1. Mammography due now and patient knows to schedule.  Breast exam normal today. 2. Pap smear 03/2016.  No Pap smear done today.  No history of abnormal Pap smears.  Plan repeat Pap smear next year at 3-year interval per current screening guidelines. 3. Health maintenance.  Patient in the process of arranging a new PCP.  Screening blood work offered but declined and she is going to wait and have this done at her new physician's office.  Follow-up in 1 year, sooner  as needed.   Anastasio Auerbach MD, 12:05 PM 02/24/2018

## 2018-02-24 NOTE — Patient Instructions (Signed)
Follow-up in 1 year for annual exam, sooner as needed. 

## 2018-08-08 ENCOUNTER — Other Ambulatory Visit: Payer: Self-pay | Admitting: Nurse Practitioner

## 2018-08-08 DIAGNOSIS — Z1231 Encounter for screening mammogram for malignant neoplasm of breast: Secondary | ICD-10-CM

## 2018-08-24 ENCOUNTER — Ambulatory Visit
Admission: RE | Admit: 2018-08-24 | Discharge: 2018-08-24 | Disposition: A | Discharge status: Home / Self Care | Payer: 59 | Source: Ambulatory Visit | Attending: Nurse Practitioner | Admitting: Nurse Practitioner

## 2018-08-24 ENCOUNTER — Encounter (INDEPENDENT_AMBULATORY_CARE_PROVIDER_SITE_OTHER): Payer: Self-pay

## 2018-08-24 DIAGNOSIS — Z1231 Encounter for screening mammogram for malignant neoplasm of breast: Secondary | ICD-10-CM | POA: Diagnosis not present

## 2018-08-24 DIAGNOSIS — J302 Other seasonal allergic rhinitis: Secondary | ICD-10-CM | POA: Diagnosis not present

## 2018-08-24 DIAGNOSIS — H93A9 Pulsatile tinnitus, unspecified ear: Secondary | ICD-10-CM | POA: Diagnosis not present

## 2018-08-24 DIAGNOSIS — K219 Gastro-esophageal reflux disease without esophagitis: Secondary | ICD-10-CM | POA: Diagnosis not present

## 2018-08-24 DIAGNOSIS — Z1211 Encounter for screening for malignant neoplasm of colon: Secondary | ICD-10-CM | POA: Diagnosis not present

## 2018-08-24 DIAGNOSIS — E559 Vitamin D deficiency, unspecified: Secondary | ICD-10-CM | POA: Diagnosis not present

## 2018-08-24 DIAGNOSIS — Z136 Encounter for screening for cardiovascular disorders: Secondary | ICD-10-CM | POA: Diagnosis not present

## 2018-08-24 DIAGNOSIS — R7303 Prediabetes: Secondary | ICD-10-CM | POA: Diagnosis not present

## 2018-08-24 DIAGNOSIS — R1310 Dysphagia, unspecified: Secondary | ICD-10-CM | POA: Diagnosis not present

## 2018-09-20 DIAGNOSIS — K229 Disease of esophagus, unspecified: Secondary | ICD-10-CM | POA: Diagnosis not present

## 2018-09-20 DIAGNOSIS — R1310 Dysphagia, unspecified: Secondary | ICD-10-CM | POA: Diagnosis not present

## 2018-09-20 DIAGNOSIS — Z1211 Encounter for screening for malignant neoplasm of colon: Secondary | ICD-10-CM | POA: Diagnosis not present

## 2018-09-20 DIAGNOSIS — B3781 Candidal esophagitis: Secondary | ICD-10-CM | POA: Diagnosis not present

## 2018-09-20 DIAGNOSIS — Z8371 Family history of colonic polyps: Secondary | ICD-10-CM | POA: Diagnosis not present

## 2018-09-20 DIAGNOSIS — K219 Gastro-esophageal reflux disease without esophagitis: Secondary | ICD-10-CM | POA: Diagnosis not present

## 2018-10-03 DIAGNOSIS — H93A9 Pulsatile tinnitus, unspecified ear: Secondary | ICD-10-CM | POA: Diagnosis not present

## 2018-10-03 DIAGNOSIS — Z0110 Encounter for examination of ears and hearing without abnormal findings: Secondary | ICD-10-CM | POA: Diagnosis not present

## 2018-10-03 DIAGNOSIS — H93A1 Pulsatile tinnitus, right ear: Secondary | ICD-10-CM | POA: Diagnosis not present

## 2018-10-03 DIAGNOSIS — J302 Other seasonal allergic rhinitis: Secondary | ICD-10-CM | POA: Diagnosis not present

## 2018-10-05 ENCOUNTER — Telehealth (HOSPITAL_COMMUNITY): Payer: Self-pay | Admitting: Surgery

## 2018-10-05 NOTE — Telephone Encounter (Signed)
Received a call from [facility] at Dr. Danie Binder office requesting a carotid duplex for pulsatile ringing in ears.  I advised I wasn't sure if this would be a covered diagnosis.   Jessica Contreras went through the office notes and didn't find any other indications.  I discussed with my team lead, Kathy Comfort, she confirmed this would not be a covered diagnosis.   I spoke with [facility] again, the pulsatile ringing in the ears is not causing any other symptoms like, syncope.    I advised Jessica Contreras I was going to call the patient to advise that she may hold the responsibility of the bill due to the diagnosis.   I spoke with Jessica Contreras, she cannot afford the test nor can she afford if the insurance denies the test.   She would like the referring office to do a prior approval before she comes into the office.  I advised the appointment scheduled for 11/14 will be cancelled, I will let Jessica Contreras know she needs to obtain prior approval, and an appointment will be scheduled based on the determination from the insurance company.

## 2018-10-06 ENCOUNTER — Encounter (HOSPITAL_COMMUNITY): Payer: 59

## 2018-10-11 ENCOUNTER — Other Ambulatory Visit (HOSPITAL_COMMUNITY): Payer: Self-pay | Admitting: Otolaryngology

## 2018-10-11 DIAGNOSIS — H9319 Tinnitus, unspecified ear: Secondary | ICD-10-CM

## 2018-10-12 ENCOUNTER — Ambulatory Visit (HOSPITAL_COMMUNITY)
Admission: RE | Admit: 2018-10-12 | Discharge: 2018-10-12 | Disposition: A | Discharge status: Home / Self Care | Payer: 59 | Source: Ambulatory Visit | Attending: Otolaryngology | Admitting: Otolaryngology

## 2018-10-12 DIAGNOSIS — H9319 Tinnitus, unspecified ear: Secondary | ICD-10-CM | POA: Diagnosis not present

## 2018-10-12 NOTE — Progress Notes (Signed)
Carotid artery duplex has been completed. 1-39% ICA stenosis bilaterally.  10/12/18 9:39 AM Carlos Levering RVT

## 2018-11-18 ENCOUNTER — Ambulatory Visit: Payer: 59 | Admitting: Gynecology

## 2018-11-18 ENCOUNTER — Encounter: Payer: Self-pay | Admitting: Gynecology

## 2018-11-18 VITALS — BP 128/80

## 2018-11-18 DIAGNOSIS — N393 Stress incontinence (female) (male): Secondary | ICD-10-CM | POA: Diagnosis not present

## 2018-11-18 DIAGNOSIS — R102 Pelvic and perineal pain: Secondary | ICD-10-CM

## 2018-11-18 NOTE — Patient Instructions (Signed)
Follow up for ultrasound as scheduled 

## 2018-11-18 NOTE — Progress Notes (Signed)
    Jessica Contreras 1971/11/20 [id number]        47 y.o.  [id number] presents with 1 year history of intermittent pelvic pain primarily on the left.  Seems to be worse with her menses.  Occurs when she is having bowel movements primarily.  Not every time.  No significant nausea vomiting diarrhea constipation.  Was evaluated by GI to include colonoscopy by her history which was negative.  Having regular monthly menses with tubal sterilization.  Having SUI symptoms with slight loss of urine with coughing sneezing laughing.  No UTI symptoms.  Past medical history,surgical history, problem list, medications, allergies, family history and social history were all reviewed and documented in the EPIC chart.  Directed ROS with pertinent positives and negatives documented in the history of present illness/assessment and plan.  Exam: [name] assistant Vitals:   11/18/18 1212  BP: 128/80   General appearance:  Normal Abdomen soft nontender without masses guarding rebound Pelvic external BUS vagina normal.  Cervix normal.  Uterus normal size midline mobile nontender.  Adnexa without masses or tenderness.  Rectal exam is normal.  Assessment/Plan:  47 y.o. [id number] with intermittent left pelvic pain primarily with menses and bowel movements.  Reports GI work-up negative.  Not consistent every menses or every bowel movement.  Discussed differential to include GYN versus non-GYN.  Pelvic adhesions as well as endometriosis possibilities reviewed.  Recommend starting with pelvic ultrasound.  She will follow-up for this and then will go from there.  I also discussed SUI with her.  Classic history.  Will check baseline urine analysis.  Kegel exercises up to and including surgery options reviewed.  She will follow-up if she is interested in pursuing any intervention.    Anastasio Auerbach MD, 12:30 PM 11/18/2018

## 2018-11-19 LAB — URINALYSIS, COMPLETE W/RFL CULTURE
BILIRUBIN URINE: NEGATIVE
Bacteria, UA: NONE SEEN /HPF
Glucose, UA: NEGATIVE
Hgb urine dipstick: NEGATIVE
Hyaline Cast: NONE SEEN /LPF
KETONES UR: NEGATIVE
Leukocyte Esterase: NEGATIVE
Nitrites, Initial: NEGATIVE
Protein, ur: NEGATIVE
RBC / HPF: NONE SEEN /HPF (ref 0–2)
SPECIFIC GRAVITY, URINE: 1.01 (ref 1.001–1.03)
WBC UA: NONE SEEN /HPF (ref 0–5)
pH: 5 (ref 5.0–8.0)

## 2018-11-19 LAB — NO CULTURE INDICATED

## 2018-11-21 ENCOUNTER — Encounter: Payer: Self-pay | Admitting: Gynecology

## 2018-11-28 ENCOUNTER — Encounter: Payer: Self-pay | Admitting: Gynecology

## 2018-12-12 ENCOUNTER — Emergency Department (HOSPITAL_COMMUNITY): Payer: 59

## 2018-12-12 ENCOUNTER — Emergency Department (HOSPITAL_COMMUNITY)
Admission: EM | Admit: 2018-12-12 | Discharge: 2018-12-12 | Disposition: A | Discharge status: Home / Self Care | Payer: 59 | Attending: Emergency Medicine | Admitting: Emergency Medicine

## 2018-12-12 ENCOUNTER — Encounter (HOSPITAL_COMMUNITY): Payer: Self-pay | Admitting: Emergency Medicine

## 2018-12-12 ENCOUNTER — Other Ambulatory Visit: Payer: Self-pay

## 2018-12-12 DIAGNOSIS — R0789 Other chest pain: Secondary | ICD-10-CM | POA: Insufficient documentation

## 2018-12-12 DIAGNOSIS — I10 Essential (primary) hypertension: Secondary | ICD-10-CM | POA: Diagnosis not present

## 2018-12-12 DIAGNOSIS — R079 Chest pain, unspecified: Secondary | ICD-10-CM | POA: Diagnosis not present

## 2018-12-12 DIAGNOSIS — Z79899 Other long term (current) drug therapy: Secondary | ICD-10-CM | POA: Insufficient documentation

## 2018-12-12 HISTORY — DX: Other chest pain: R07.89

## 2018-12-12 LAB — CBC WITH DIFFERENTIAL/PLATELET
ABS IMMATURE GRANULOCYTES: 0.01 10*3/uL (ref 0.00–0.07)
BASOS PCT: 0 %
Basophils Absolute: 0 10*3/uL (ref 0.0–0.1)
EOS ABS: 0.1 10*3/uL (ref 0.0–0.5)
Eosinophils Relative: 1 %
HCT: 37.1 % (ref 36.0–46.0)
Hemoglobin: 11.6 g/dL — ABNORMAL LOW (ref 12.0–15.0)
Immature Granulocytes: 0 %
Lymphocytes Relative: 42 %
Lymphs Abs: 2.2 10*3/uL (ref 0.7–4.0)
MCH: 29.5 pg (ref 26.0–34.0)
MCHC: 31.3 g/dL (ref 30.0–36.0)
MCV: 94.4 fL (ref 80.0–100.0)
Monocytes Absolute: 0.6 10*3/uL (ref 0.1–1.0)
Monocytes Relative: 10 %
NEUTROS ABS: 2.4 10*3/uL (ref 1.7–7.7)
NEUTROS PCT: 47 %
NRBC: 0 % (ref 0.0–0.2)
PLATELETS: 347 10*3/uL (ref 150–400)
RBC: 3.93 MIL/uL (ref 3.87–5.11)
RDW: 14.3 % (ref 11.5–15.5)
WBC: 5.3 10*3/uL (ref 4.0–10.5)

## 2018-12-12 LAB — BASIC METABOLIC PANEL
Anion gap: 10 (ref 5–15)
BUN: 10 mg/dL (ref 6–20)
CO2: 22 mmol/L (ref 22–32)
Calcium: 8.9 mg/dL (ref 8.9–10.3)
Chloride: 106 mmol/L (ref 98–111)
Creatinine, Ser: 0.86 mg/dL (ref 0.44–1.00)
GFR calc Af Amer: >60 mL/min (ref >60)
GLUCOSE: 94 mg/dL (ref 70–99)
POTASSIUM: 4.2 mmol/L (ref 3.5–5.1)
Sodium: 138 mmol/L (ref 135–145)

## 2018-12-12 LAB — I-STAT TROPONIN, ED: Troponin i, poc: 0 ng/mL (ref 0.00–0.08)

## 2018-12-12 MED ORDER — IBUPROFEN 800 MG PO TABS: 800.0000 mg | ORAL_TABLET | Freq: Three times a day (TID) | ORAL | 0 refills | Status: DC

## 2018-12-12 NOTE — Discharge Instructions (Signed)
Your workup here was unremarkable.  Return for worsening symptoms.   Take 4 over the counter ibuprofen tablets 3 times a day or 2 over-the-counter naproxen tablets twice a day for pain. Also take tylenol 1000mg (2 extra strength) four times a day.

## 2018-12-12 NOTE — ED Triage Notes (Signed)
Pt complains of centralized CP radiating to the left side and arm. Pt states it started last night

## 2018-12-12 NOTE — ED Provider Notes (Signed)
[facility] EMERGENCY DEPARTMENT Provider Note   CSN: [id number] Arrival date & time: 12/12/18  1327     History   Chief Complaint No chief complaint on file.   HPI Jessica Contreras is a 48 y.o. female.  48 yo F with a chief complaint of left-sided sharp chest pain.  This started last night.  She woke up this morning and it persisted.  Worse when she lays back flat.  She denies cough congestion or fever denies hemoptysis denies unilateral lower extremity edema denies shortness of breath denies nausea or vomiting.  She denies history of PE or DVT denies recent surgery immobilization or estrogen use.  She denies history of cancer.  Denies history of MI.  Denies history of hypertension.  Has a history of hyperlipidemia.  Denies diabetes or family history of MI.  Denies smoking.  The history is provided by the patient.  Illness  Severity:  Mild Onset quality:  Sudden Duration:  2 days Timing:  Constant Progression:  Worsening Chronicity:  New Associated symptoms: chest pain   Associated symptoms: no congestion, no fever, no headaches, no myalgias, no nausea, no rhinorrhea, no shortness of breath, no vomiting and no wheezing     Past Medical History:  Diagnosis Date  . Arthritis   . Gout   . Hypertension     Patient Active Problem List   Diagnosis Date Noted  . LOM 04/21/2008  . CONTACT DERMATITIS&OTHER ECZEMA DUE TO PLANTS 04/21/2008    Past Surgical History:  Procedure Laterality Date  . TUBAL LIGATION     POST PARTUM     OB History    Gravida  3   Para  2   Term      Preterm      AB  1   Living  2     SAB  1   TAB      Ectopic      Multiple      Live Births               Home Medications    Prior to Admission medications   Medication Sig Start Date End Date Taking? Authorizing Provider  albuterol (PROVENTIL HFA;VENTOLIN HFA) 108 (90 Base) MCG/ACT inhaler Inhale 2 puffs into the lungs every 4 (four) hours as needed for  wheezing or shortness of breath. 01/27/16   Konrad Felix, PA  dexlansoprazole (DEXILANT) 60 MG capsule Take 1 tablet by mouth daily. 09/07/18   [provider]  fluticasone (FLONASE) 50 MCG/ACT nasal spray Place into both nostrils daily.    [provider]  ibuprofen (ADVIL,MOTRIN) 800 MG tablet Take 1 tablet (800 mg total) by mouth 3 (three) times daily. 12/08/15   Cartner, Marland Kitchen, PA-C  naproxen (NAPROSYN) 500 MG tablet Take 1 tablet (500 mg total) by mouth 2 (two) times daily. 09/10/17   Marney Setting, NP    Family History Family History  Problem Relation Age of Onset  . Cancer Maternal Aunt        LUNG  . Diabetes Maternal Grandmother   . Hypertension Maternal Grandmother   . Sarcoidosis Mother   . Breast cancer Neg Hx     Social History Social History   Tobacco Use  . Smoking status: Never Smoker  . Smokeless tobacco: Never Used  . Tobacco comment: was only a social smoker  Substance Use Topics  . Alcohol use: Yes    Alcohol/week: 0.0 standard drinks    Comment:  Rare  . Drug use: No     Allergies   Morphine and related   Review of Systems Review of Systems  Constitutional: Negative for chills and fever.  HENT: Negative for congestion and rhinorrhea.   Eyes: Negative for redness and visual disturbance.  Respiratory: Negative for shortness of breath and wheezing.   Cardiovascular: Positive for chest pain. Negative for palpitations.  Gastrointestinal: Negative for nausea and vomiting.  Genitourinary: Negative for dysuria and urgency.  Musculoskeletal: Negative for arthralgias and myalgias.  Skin: Negative for pallor and wound.  Neurological: Negative for dizziness and headaches.     Physical Exam Updated Vital Signs BP 127/86 (BP Location: Right Arm)   Pulse (!) 57   Temp 98.4 F (36.9 C) (Oral)   Resp 12   SpO2 100%   Physical Exam Vitals signs and nursing note reviewed.  Constitutional:      General: She is not in acute  distress.    Appearance: She is well-developed. She is not diaphoretic.  HENT:     Head: Normocephalic and atraumatic.  Eyes:     Pupils: Pupils are equal, round, and reactive to light.  Neck:     Musculoskeletal: Normal range of motion and neck supple.  Cardiovascular:     Rate and Rhythm: Normal rate and regular rhythm.     Heart sounds: No murmur. No friction rub. No gallop.   Pulmonary:     Effort: Pulmonary effort is normal.     Breath sounds: No wheezing or rales.     Comments: Chest wall pain just left of the sternal border reproduces the patient's symptoms.  Mild pain to the supraclavicular fossa on the left.  Pulse motor and sensation is intact to the LUE. Chest:     Chest wall: Tenderness present.  Abdominal:     General: There is no distension.     Palpations: Abdomen is soft.     Tenderness: There is no abdominal tenderness.  Musculoskeletal:        General: No tenderness.  Skin:    General: Skin is warm and dry.  Neurological:     Mental Status: She is alert and oriented to person, place, and time.  Psychiatric:        Behavior: Behavior normal.      ED Treatments / Results  Labs (all labs ordered are listed, but only abnormal results are displayed) Labs Reviewed  CBC WITH DIFFERENTIAL/PLATELET - Abnormal; Notable for the following components:      Result Value   Hemoglobin 11.6 (*)    All other components within normal limits  BASIC METABOLIC PANEL  I-STAT TROPONIN, ED    EKG EKG Interpretation  Date/Time:  Monday December 12 2018 13:37:26 EST Ventricular Rate:  58 PR Interval:    QRS Duration: 95 QT Interval:  415 QTC Calculation: 408 R Axis:   74 Text Interpretation:  Sinus rhythm No significant change since last tracing Confirmed by Deno Etienne [phone]) on 12/12/2018 2:27:17 PM   Radiology Dg Chest 2 View  Result Date: 12/12/2018 CLINICAL DATA:  Chest pain EXAM: CHEST - 2 VIEW COMPARISON:  June 23, 2013 FINDINGS: The lungs are clear. The  heart size and pulmonary vascularity are normal. No adenopathy. No pneumothorax. No bone lesions. IMPRESSION: No edema or consolidation. Electronically Signed   By: Lowella Grip III M.D.   On: 12/12/2018 15:02    Procedures Procedures (including critical care time)  Medications Ordered in ED Medications - No data to display  Initial Impression / Assessment and Plan / ED Course  I have reviewed the triage vital signs and the nursing notes.  Pertinent labs & imaging results that were available during my care of the patient were reviewed by me and considered in my medical decision making (see chart for details).     48 yo F with a chief complaint of atypical chest pain.  This is reproduced on palpation on exam.  EKG with no concerning finding.  Will obtain a chest x-ray.  Patient has had pain ongoing for greater than 6 hours without significant change.  Will obtain a single troponin.  She is PERC negative.   Troponin is negative.  No leukocytosis.  Anemia appears to be at baseline.  Chest x-ray reviewed by me without focal infiltrate.  Discharge home.  3:10 PM:  I have discussed the diagnosis/risks/treatment options with the patient and family and believe the pt to be eligible for discharge home to follow-up with PCP. We also discussed returning to the ED immediately if new or worsening sx occur. We discussed the sx which are most concerning (e.g., sudden worsening pain, fever, inability to tolerate by mouth) that necessitate immediate return. Medications administered to the patient during their visit and any new prescriptions provided to the patient are listed below.  Medications given during this visit Medications - No data to display   The patient appears reasonably screen and/or stabilized for discharge and I doubt any other medical condition or other [facility] requiring further screening, evaluation, or treatment in the ED at this time prior to discharge.    Final Clinical  Impressions(s) / ED Diagnoses   Final diagnoses:  Chest wall pain    ED Discharge Orders    None       Deno Etienne, DO 12/12/18 1510

## 2018-12-12 NOTE — ED Notes (Signed)
Patient transported to X-ray 

## 2018-12-12 NOTE — ED Notes (Signed)
Patient verbalizes understanding of discharge instructions. Opportunity for questioning and answers were provided. Armband removed by staff, pt discharged from ED ambulatory to home.  

## 2018-12-19 ENCOUNTER — Ambulatory Visit: Payer: 59 | Admitting: Gynecology

## 2018-12-19 ENCOUNTER — Other Ambulatory Visit: Payer: 59

## 2019-03-01 ENCOUNTER — Encounter: Payer: 59 | Admitting: Gynecology

## 2019-03-02 ENCOUNTER — Encounter: Payer: 59 | Admitting: Gynecology

## 2019-07-25 DIAGNOSIS — B349 Viral infection, unspecified: Secondary | ICD-10-CM

## 2019-07-25 HISTORY — DX: Viral infection, unspecified: B34.9

## 2019-08-15 ENCOUNTER — Encounter: Payer: Self-pay | Admitting: Gynecology

## 2019-08-24 ENCOUNTER — Other Ambulatory Visit: Payer: Self-pay

## 2019-08-24 DIAGNOSIS — A609 Anogenital herpesviral infection, unspecified: Secondary | ICD-10-CM

## 2019-08-24 HISTORY — DX: Anogenital herpesviral infection, unspecified: A60.9

## 2019-08-25 ENCOUNTER — Encounter: Payer: Self-pay | Admitting: Gynecology

## 2019-08-25 ENCOUNTER — Telehealth: Payer: Self-pay | Admitting: *Deleted

## 2019-08-25 ENCOUNTER — Ambulatory Visit: Payer: 59 | Admitting: Gynecology

## 2019-08-25 VITALS — BP 124/80

## 2019-08-25 DIAGNOSIS — N3946 Mixed incontinence: Secondary | ICD-10-CM | POA: Diagnosis not present

## 2019-08-25 DIAGNOSIS — R21 Rash and other nonspecific skin eruption: Secondary | ICD-10-CM

## 2019-08-25 MED ORDER — ACYCLOVIR 400 MG PO TABS: 400.0000 mg | ORAL_TABLET | Freq: Three times a day (TID) | ORAL | 0 refills | Status: DC

## 2019-08-25 MED ORDER — VALACYCLOVIR HCL 1 G PO TABS: 1000.0000 mg | ORAL_TABLET | Freq: Two times a day (BID) | ORAL | 0 refills | Status: DC

## 2019-08-25 MED ORDER — METRONIDAZOLE 500 MG PO TABS: 500.0000 mg | ORAL_TABLET | Freq: Two times a day (BID) | ORAL | 0 refills | Status: DC

## 2019-08-25 NOTE — Telephone Encounter (Signed)
New Rx sent.

## 2019-08-25 NOTE — Addendum Note (Signed)
Addended by: Nelva Nay on: 08/25/2019 11:37 AM   Modules accepted: Orders

## 2019-08-25 NOTE — Patient Instructions (Signed)
Take the Flagyl medication twice daily for 7 days.  Avoid alcohol while taking.  This is to treat a possible vaginal infection that is leading to the vaginal odor. Take the Valtrex 1000 mg prescription twice daily for 7 days to treat the buttocks rash.  The office will call with the culture results next week.

## 2019-08-25 NOTE — Telephone Encounter (Signed)
[facility] sent fax regarding Valacyclovir 1000 mg tablet, medication is not covered by insurance. The preferred alternative medication is acyclovir tablet. Okay to switch to this? Please advise

## 2019-08-25 NOTE — Telephone Encounter (Signed)
Okay for acyclovir 400 mg 3 times daily x7 days

## 2019-08-25 NOTE — Progress Notes (Signed)
    HATHAWAY HOTZ 1971/08/27 [id number]        48 y.o.  [id number] presents with 2 issues:  1. Urinary incontinence and strong urine odor smell.  Patient notes over the past 4+ months loss of urine both with urgency type symptoms feeling she has to go to the bathroom and then loses some urine on the way as well as some stress symptoms losing urine with coughing laughing sneezing.  Patient notes that she is now wearing a liner and that there is a strong urine smell with this.  No dysuria frequency low back pain fever or chills. 2. Recurrent right upper buttocks rash that comes on lasts several days to week and then goes away.  It seems to occur more frequently with stressful situations.  Has been going on for years.  Past medical history,surgical history, problem list, medications, allergies, family history and social history were all reviewed and documented in the EPIC chart.  Directed ROS with pertinent positives and negatives documented in the history of present illness/assessment and plan.  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 Pelvic external BUS vagina normal.  Cervix normal.  Uterus grossly normal size midline mobile nontender.  Adnexa without masses or tenderness.  Assessment/Plan:  48 y.o. [id number] with:  1. Urinary symptoms to suggest a mixed pattern of urgency and stress.  Urinalysis appears contaminated but negative.  We will follow-up with a culture.  It does show some clue cells.  She does not have a overt vaginal discharge or infection on exam but I am going to cover her for BV with Flagyl 500 mg twice daily x7 days as this might account for the odor that she notices.  Alcohol avoidance was discussed.  We discussed her urinary incontinence both and options for management.  Surgical options  reviewed.  Behavior modification such as avoidance of bladder irritants such as caffeine and spicy foods, bladder training as well as OAB medications were reviewed.  Side effects and benefits of OAB medications reviewed. 2. 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  HSV screen done.  Discussed this with the patient.  Discussed possible antibody screening also but at this point will await the PCR HSV screen.  Will initiate Valtrex 1000 milligrams twice daily for 7 days.  Options for suppressive therapy also discussed.  Will wait PCR results and patient will think about whether she wants to start suppressive therapy or use intermittent treatment for recurrences.  25 minutes face-to-face and review of records spent addressing both issues as well as prescriptions for medications.    Anastasio Auerbach MD, 11:18 AM 08/25/2019

## 2019-08-29 LAB — URINE CULTURE
MICRO NUMBER:: 953988
SPECIMEN QUALITY:: ADEQUATE

## 2019-08-29 LAB — URINALYSIS, COMPLETE W/RFL CULTURE
Bilirubin Urine: NEGATIVE
Glucose, UA: NEGATIVE
Hgb urine dipstick: NEGATIVE
Hyaline Cast: NONE SEEN /LPF
Ketones, ur: NEGATIVE
Leukocyte Esterase: NEGATIVE
Nitrites, Initial: NEGATIVE
Protein, ur: NEGATIVE
RBC / HPF: NONE SEEN /HPF (ref 0–2)
Specific Gravity, Urine: 1.025 (ref 1.001–1.03)
pH: 5.5 (ref 5.0–8.0)

## 2019-08-29 LAB — CULTURE INDICATED

## 2019-08-30 ENCOUNTER — Telehealth: Payer: Self-pay

## 2019-08-30 ENCOUNTER — Other Ambulatory Visit: Payer: Self-pay

## 2019-08-30 ENCOUNTER — Encounter: Payer: Self-pay | Admitting: Gynecology

## 2019-08-30 LAB — SURESWAB HSV, TYPE 1/2 DNA, PCR
HSV 1 DNA: NOT DETECTED
HSV 2 DNA: DETECTED — AB

## 2019-08-30 MED ORDER — ACYCLOVIR 400 MG PO TABS: ORAL_TABLET | ORAL | 2 refills | Status: DC

## 2019-08-30 MED ORDER — VALACYCLOVIR HCL 500 MG PO TABS: 500.0000 mg | ORAL_TABLET | Freq: Every day | ORAL | 0 refills | Status: DC

## 2019-08-30 MED ORDER — VALACYCLOVIR HCL 500 MG PO TABS: ORAL_TABLET | ORAL | 2 refills | Status: DC

## 2019-08-30 NOTE — Telephone Encounter (Signed)
Pharmacist called me back and said that Valtrex is actually covered and only a copaymt on patient plan. She was note sure why fax was generated and sent to [place] (2x) saying it was not covered.  I cancelled the Acyclovir and she has filled the Valtrex for the patient and it is ready to pick up.

## 2019-08-30 NOTE — Telephone Encounter (Signed)
I left message for patient as I had told her in previous conversation this might happen.  I let her know Acyclovir Rx sent.  Call if any questions.

## 2019-08-30 NOTE — Telephone Encounter (Signed)
Okay for acyclovir 400 mg daily

## 2019-08-30 NOTE — Telephone Encounter (Signed)
I spoke with patient earlier today about pos HSV result. She opted for daily prophylactic dose.   However, pharmacy notified "Drug not covered by patient plan.  There preferred alternative is Acyclovir Tab."

## 2019-08-30 NOTE — Telephone Encounter (Signed)
Called and left message w pharmacy to cancel Valtrex.

## 2019-08-30 NOTE — Addendum Note (Signed)
Addended by: Ramond Craver on: 08/30/2019 04:25 PM   Modules accepted: Orders

## 2019-09-21 ENCOUNTER — Encounter: Payer: 59 | Admitting: Gynecology

## 2019-10-03 ENCOUNTER — Encounter: Payer: 59 | Admitting: Gynecology

## 2019-10-11 ENCOUNTER — Other Ambulatory Visit: Payer: Self-pay

## 2019-10-11 ENCOUNTER — Other Ambulatory Visit: Payer: Self-pay | Admitting: Gynecology

## 2019-10-11 ENCOUNTER — Encounter: Payer: Self-pay | Admitting: Gynecology

## 2019-10-11 ENCOUNTER — Ambulatory Visit: Payer: 59 | Admitting: Gynecology

## 2019-10-11 VITALS — BP 118/74 | Ht 64.0 in | Wt 253.0 lb

## 2019-10-11 DIAGNOSIS — N3941 Urge incontinence: Secondary | ICD-10-CM | POA: Diagnosis not present

## 2019-10-11 DIAGNOSIS — Z01419 Encounter for gynecological examination (general) (routine) without abnormal findings: Secondary | ICD-10-CM

## 2019-10-11 DIAGNOSIS — Z1151 Encounter for screening for human papillomavirus (HPV): Secondary | ICD-10-CM | POA: Diagnosis not present

## 2019-10-11 NOTE — Patient Instructions (Signed)
Schedule your mammogram  Call if you would like a referral to urology  Follow-up in 1 year for annual exam

## 2019-10-11 NOTE — Progress Notes (Signed)
    THAINA LICANO 1971-08-07 [id number]        48 y.o.  [id number] for annual gynecologic exam.  Several issues noted below  Past medical history,surgical history, problem list, medications, allergies, family history and social history were all reviewed and documented as reviewed in the EPIC chart.  ROS:  Performed with pertinent positives and negatives included in the history, assessment and plan.   Additional significant findings : None   Exam: [name] assistant Vitals:   10/11/19 1031  BP: 118/74  Weight: 253 lb (114.8 kg)  Height: 5\' 4"  (1.626 m)   Body mass index is 43.43 kg/m.  General appearance:  Normal affect, orientation and appearance. Skin: Grossly normal HEENT: Without gross lesions.  No cervical or supraclavicular adenopathy. Thyroid normal.  Lungs:  Clear without wheezing, rales or rhonchi Cardiac: RR, without RMG Abdominal:  Soft, nontender, without masses, guarding, rebound, organomegaly or hernia Breasts:  Examined lying and sitting without masses, retractions, discharge or axillary adenopathy. Pelvic:  Ext, BUS, Vagina: Normal  Cervix: Normal.  Pap smear/HPV  Uterus: Anteverted, normal size, shape and contour, midline and mobile nontender   Adnexa: Without masses or tenderness    Anus and perineum: Normal   Rectovaginal: Normal sphincter tone without palpated masses or tenderness.    Assessment/Plan:  48 y.o. [id number] female for annual gynecologic exam.  With regular menses, tubal sterilization  1. Recent evaluation for upper buttocks rash was positive PCR for HSV.  Has started on Valtrex 500 mg daily suppression.  We will continue on this.  Will call if she has recurrent outbreaks. 2. Recent discussion about incontinence as a mixed pattern of urge and stress.  We again rediscussed this.  Options for management to include OAB medication as well as urology referral reviewed.  At this point the patient's not interested in doing anything but will call if she  would like urology referral.  Check urinalysis today. 3. Mammography 08/2018.  Reminded patient she is due when she is going to call to schedule.  Breast exam normal today. 4. Pap smear 2017.  Pap smear/HPV today.  No history of abnormal Pap smears previously. 5. Health maintenance.  No routine lab work done as patient plans to have this done at her primary physician's office.  Follow-up 1 year, sooner as needed.   Anastasio Auerbach MD, 11:02 AM 10/11/2019

## 2019-10-11 NOTE — Addendum Note (Signed)
Addended by: Nelva Nay on: 10/11/2019 12:39 PM   Modules accepted: Orders

## 2019-10-12 LAB — URINALYSIS, COMPLETE W/RFL CULTURE
Bacteria, UA: NONE SEEN /HPF
Bilirubin Urine: NEGATIVE
Glucose, UA: NEGATIVE
Hgb urine dipstick: NEGATIVE
Hyaline Cast: NONE SEEN /LPF
Ketones, ur: NEGATIVE
Leukocyte Esterase: NEGATIVE
Nitrites, Initial: NEGATIVE
Protein, ur: NEGATIVE
RBC / HPF: NONE SEEN /HPF (ref 0–2)
Specific Gravity, Urine: 1.014 (ref 1.001–1.03)
WBC, UA: NONE SEEN /HPF (ref 0–5)
pH: 6.5 (ref 5.0–8.0)

## 2019-10-12 LAB — NO CULTURE INDICATED

## 2019-10-13 LAB — PAP IG AND HPV HIGH-RISK: HPV DNA High Risk: NOT DETECTED

## 2019-12-06 ENCOUNTER — Telehealth: Payer: Self-pay | Admitting: *Deleted

## 2019-12-06 MED ORDER — VALACYCLOVIR HCL 500 MG PO TABS: ORAL_TABLET | ORAL | 2 refills | Status: DC

## 2019-12-06 NOTE — Telephone Encounter (Signed)
Patient called requesting refill on Valtrex 500mg  tablet , it appears the pharmacy has been confusing the patient and our office about which Rx patient needs. See telephone encounter on 08/30/19. Rx was sent on "phone on" and Rx was never sent. Rx re-sent. Patient aware.

## 2020-04-15 IMAGING — MG DIGITAL SCREENING BILATERAL MAMMOGRAM WITH TOMO AND CAD
6 of 10 series · 6 of 30 positions shown · non-contrast
Comparison: Previous exam(s).

CLINICAL DATA: Screening.

EXAM:
DIGITAL SCREENING BILATERAL MAMMOGRAM WITH TOMO AND CAD

[R CC synth-2D (1 of 2)]
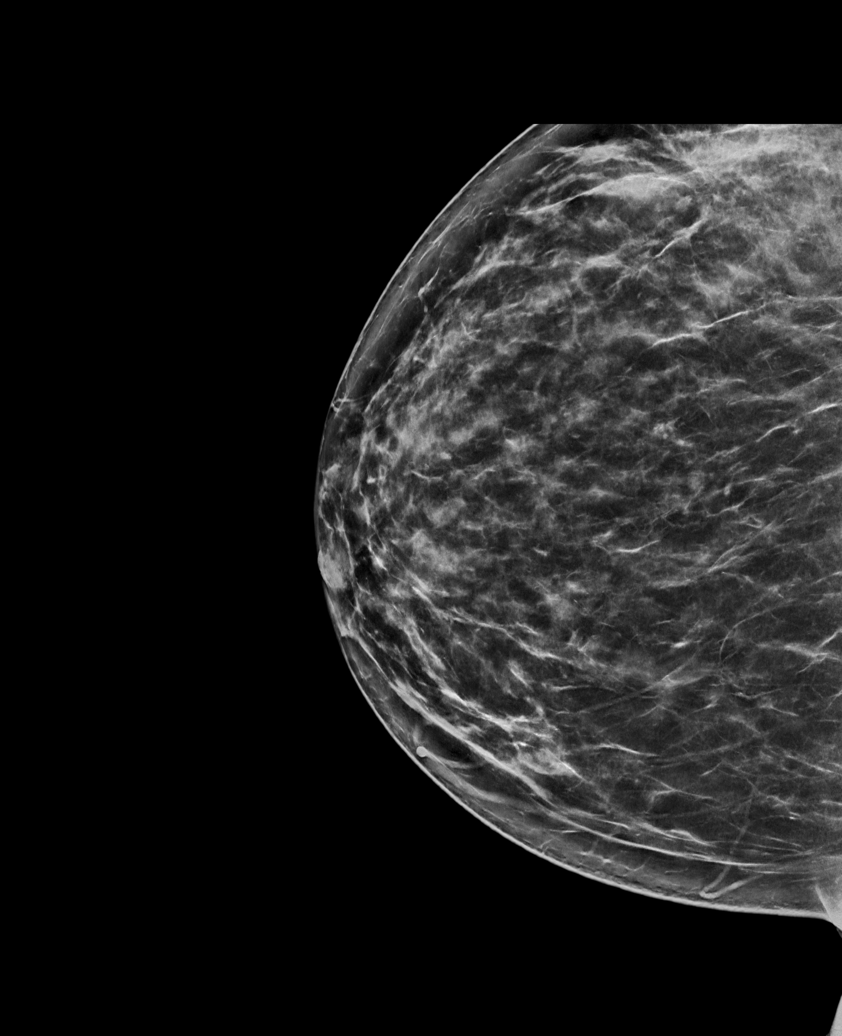

[R CC synth-2D (2 of 2)]
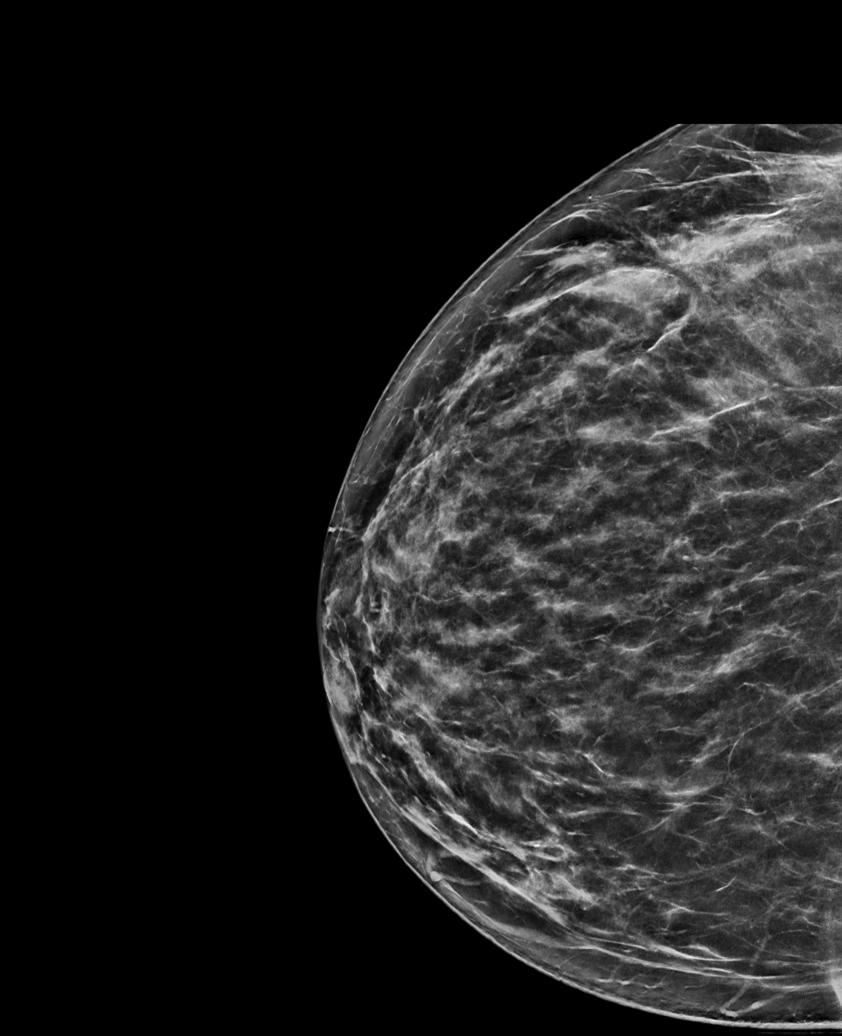

[L MLO synth-2D]
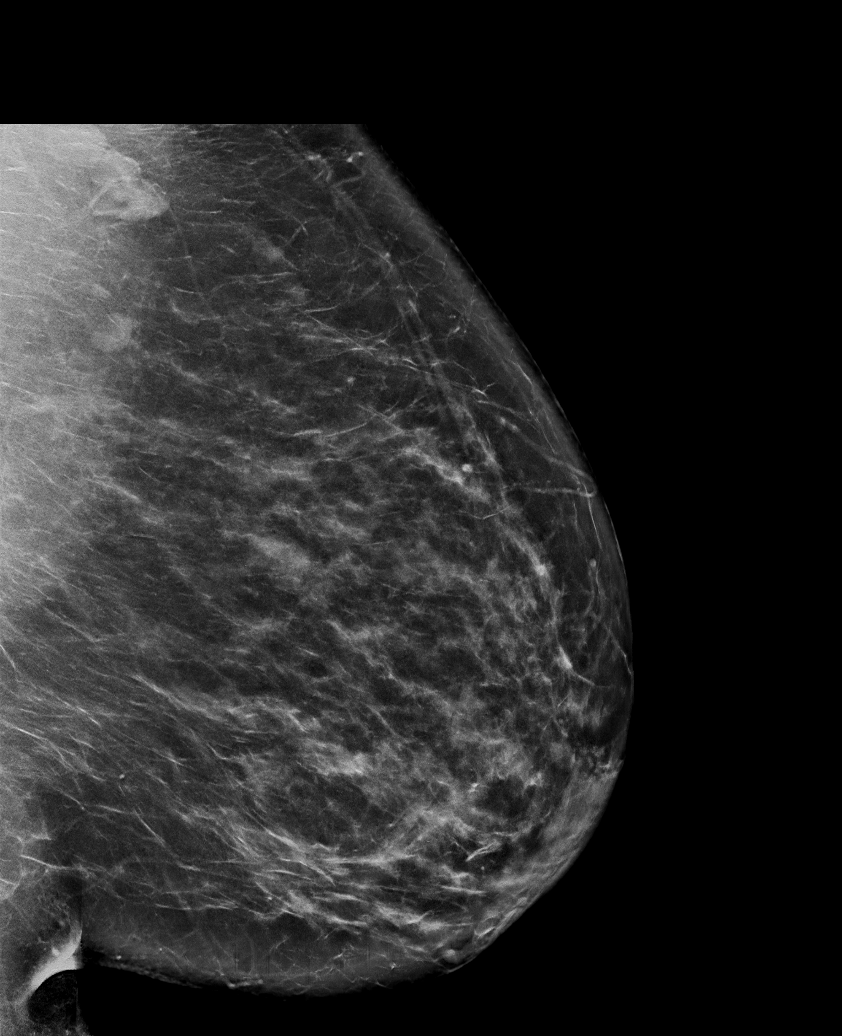

[R MLO synth-2D]
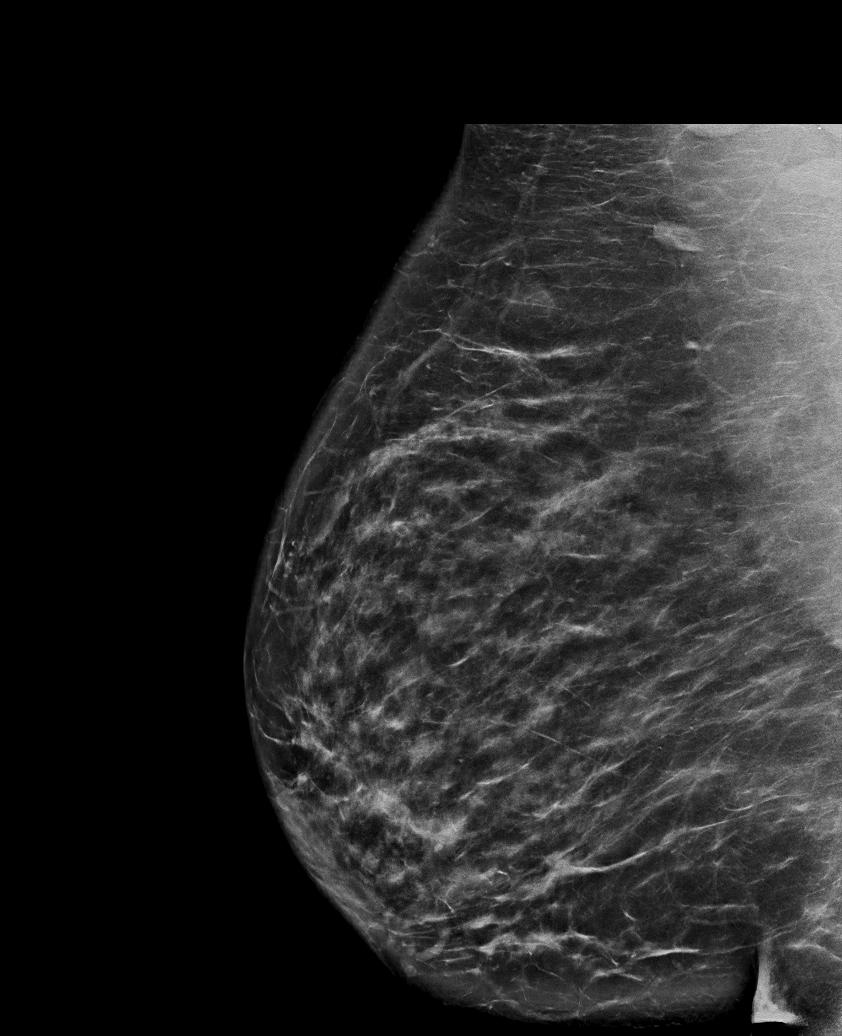

[L CC synth-2D]
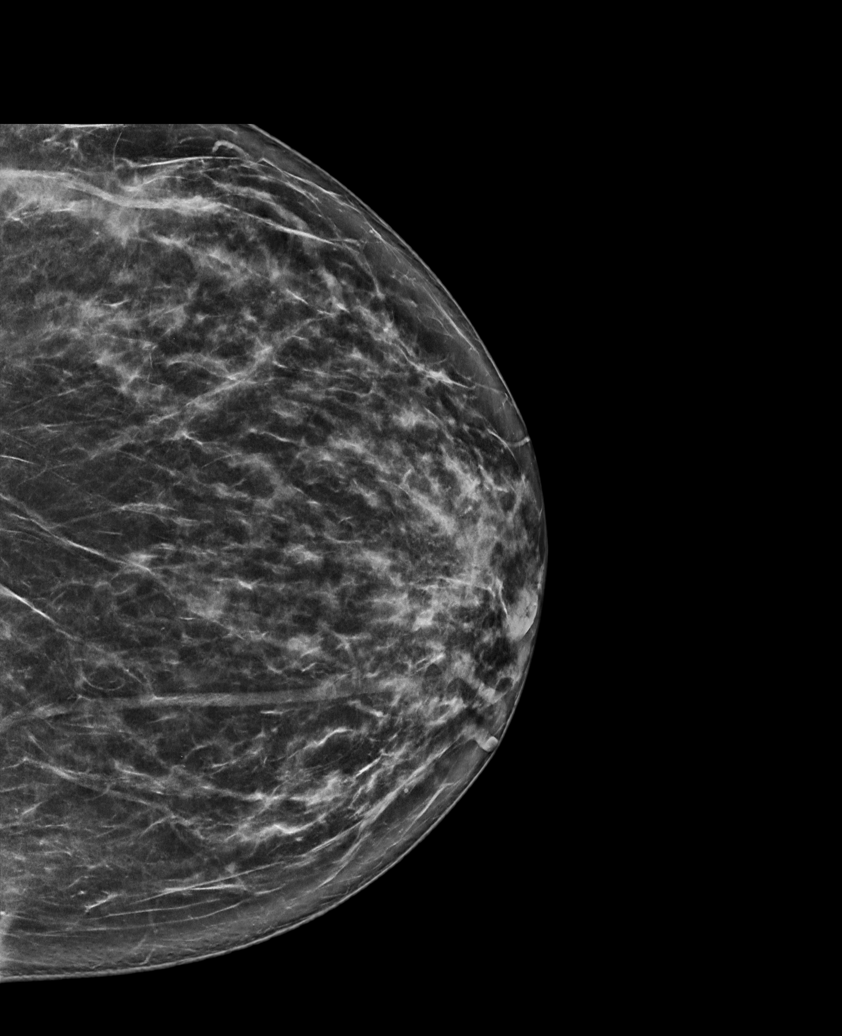

[R CC tomo · tomo slice 39/78.0]
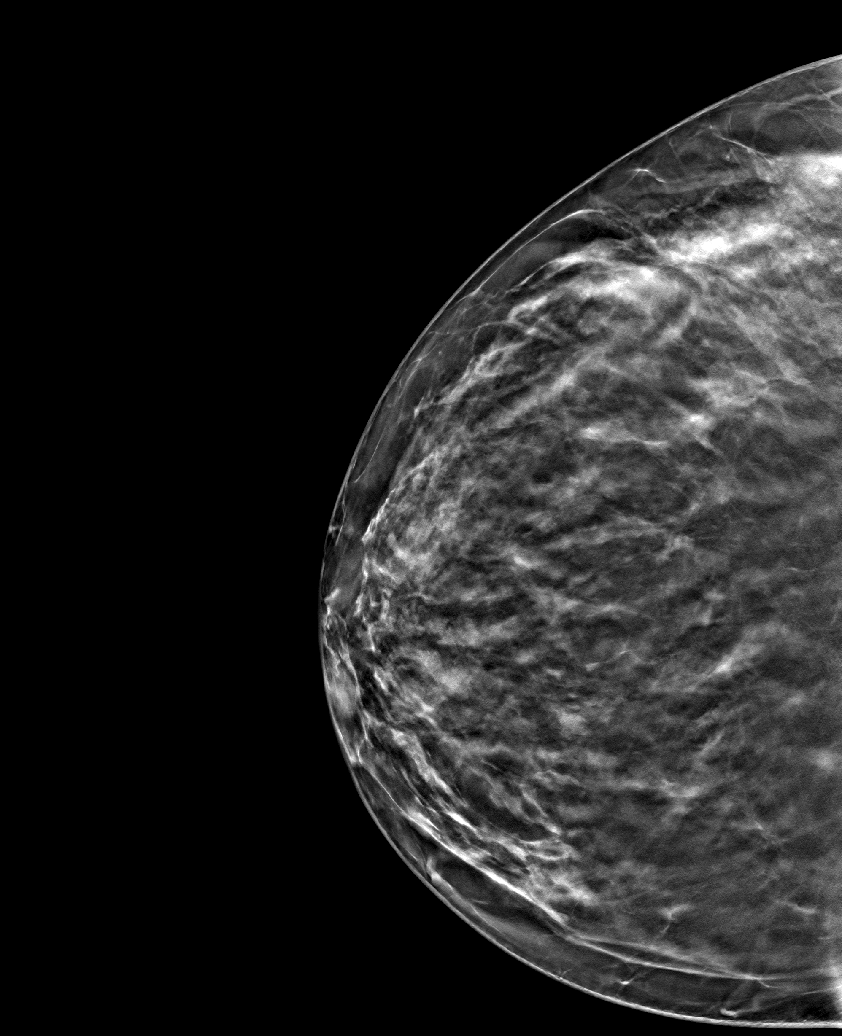

[6 of 30 positions shown; findings below may reference images not displayed]

ACR Breast Density Category c: The breast tissue is heterogeneously
dense, which may obscure small masses.
FINDINGS: There are no findings suspicious for malignancy. Images were
processed with CAD.
IMPRESSION: No mammographic evidence of malignancy. A result letter of this
screening mammogram will be mailed directly to the patient.

RECOMMENDATION:
Screening mammogram in one year. (Code:[id number])

BI-RADS CATEGORY  1: Negative.

## 2020-06-03 ENCOUNTER — Ambulatory Visit: Payer: 59 | Admitting: Obstetrics & Gynecology

## 2020-06-03 ENCOUNTER — Encounter: Payer: Self-pay | Admitting: Obstetrics & Gynecology

## 2020-06-03 ENCOUNTER — Other Ambulatory Visit: Payer: Self-pay

## 2020-06-03 VITALS — BP 132/82

## 2020-06-03 DIAGNOSIS — Z113 Encounter for screening for infections with a predominantly sexual mode of transmission: Secondary | ICD-10-CM

## 2020-06-03 DIAGNOSIS — A599 Trichomoniasis, unspecified: Secondary | ICD-10-CM | POA: Diagnosis not present

## 2020-06-03 DIAGNOSIS — N898 Other specified noninflammatory disorders of vagina: Secondary | ICD-10-CM

## 2020-06-03 DIAGNOSIS — R350 Frequency of micturition: Secondary | ICD-10-CM

## 2020-06-03 LAB — WET PREP FOR TRICH, YEAST, CLUE

## 2020-06-03 MED ORDER — METRONIDAZOLE 500 MG PO TABS: 500.0000 mg | ORAL_TABLET | Freq: Two times a day (BID) | ORAL | 0 refills | Status: AC

## 2020-06-03 NOTE — Progress Notes (Signed)
    Jessica Contreras Jan 11, 1971 [id number]        49 y.o.  [id number] Long term relationship  RP: Urinary/vaginal odor  HPI: C/O urinary or vaginal odor with a discharge. Mild urinary frequency, but eating a lot of ice.  No pelvic pain.  No fever.     OB History  Gravida Para Term Preterm AB Living  3 2     1 2   SAB TAB Ectopic Multiple Live Births  1            # Outcome Date GA Lbr Len/2nd Weight Sex Delivery Anes PTL Lv  3 SAB           2 Para           1 Para             Past medical history,surgical history, problem list, medications, allergies, family history and social history were all reviewed and documented in the EPIC chart.   Directed ROS with pertinent positives and negatives documented in the history of present illness/assessment and plan.  Exam:  Vitals:   06/03/20 1212  BP: 132/82   General appearance:  Normal  CVAT Negative bilaterally  Abdomen: Normal  Gynecologic exam: Vulva normal. Speculum: Cervix and vagina normal. Vaginal discharge present. Wet prep done. Gonorrhea and chlamydia on cervix done.  U/A: Yellow clear, protein negative, nitrite negative, white blood cells 6-10, red blood cell negative, few bacteria. Urine culture pending. Wet prep:  Trichomonas present   Assessment/Plan:  [name]   1. Vaginal discharge Vaginal discharge with odor. Wet prep showing trichomonas. No bacterial vaginosis. Patient informed that trichomonas is an STD. Recommend to throw away any sex toys that may carry it. Treat partner. Will treat with metronidazole 500 mg/tab 1 tablet twice a day for 7 days. Usage reviewed and prescription sent to pharmacy.  2. Urinary frequency Urine analysis just mildly perturbed. Will wait on urine culture before deciding on treatment. - Urinalysis,Complete w/RFL Culture  3. Screen for STD (sexually transmitted disease) Full STD screening today. Recommend condom use. - HIV antibody (with reflex) - RPR - Hepatitis C  Antibody - Hepatitis B Surface AntiGEN - C. trachomatis/N. gonorrhoeae RNA  Other orders - metroNIDAZOLE (FLAGYL) 500 MG tablet; Take 1 tablet (500 mg total) by mouth 2 (two) times daily for 7 days.  Princess Bruins MD, 12:25 PM 06/03/2020

## 2020-06-04 ENCOUNTER — Encounter: Payer: Self-pay | Admitting: Obstetrics & Gynecology

## 2020-06-04 LAB — C. TRACHOMATIS/N. GONORRHOEAE RNA
C. trachomatis RNA, TMA: NOT DETECTED
N. gonorrhoeae RNA, TMA: NOT DETECTED

## 2020-06-04 LAB — RPR: RPR Ser Ql: NONREACTIVE

## 2020-06-04 LAB — HEPATITIS B SURFACE ANTIGEN: Hepatitis B Surface Ag: NONREACTIVE

## 2020-06-04 LAB — HEPATITIS C ANTIBODY
Hepatitis C Ab: NONREACTIVE
SIGNAL TO CUT-OFF: 0.01 (ref <1.00)

## 2020-06-04 LAB — HIV ANTIBODY (ROUTINE TESTING W REFLEX): HIV 1&2 Ab, 4th Generation: NONREACTIVE

## 2020-06-05 LAB — URINE CULTURE
MICRO NUMBER:: 10692806
SPECIMEN QUALITY:: ADEQUATE

## 2020-06-05 LAB — URINALYSIS, COMPLETE W/RFL CULTURE
Bilirubin Urine: NEGATIVE
Glucose, UA: NEGATIVE
Hgb urine dipstick: NEGATIVE
Hyaline Cast: NONE SEEN /LPF
Ketones, ur: NEGATIVE
Nitrites, Initial: NEGATIVE
Protein, ur: NEGATIVE
RBC / HPF: NONE SEEN /HPF (ref 0–2)
Specific Gravity, Urine: 1.015 (ref 1.001–1.03)
pH: 7 (ref 5.0–8.0)

## 2020-06-05 LAB — CULTURE INDICATED

## 2020-11-27 ENCOUNTER — Other Ambulatory Visit: Payer: Self-pay

## 2020-11-27 ENCOUNTER — Encounter: Payer: Self-pay | Admitting: Obstetrics & Gynecology

## 2020-11-27 ENCOUNTER — Ambulatory Visit: Payer: 59 | Admitting: Obstetrics & Gynecology

## 2020-11-27 VITALS — BP 124/80 | Ht 63.0 in | Wt 242.0 lb

## 2020-11-27 DIAGNOSIS — Z8619 Personal history of other infectious and parasitic diseases: Secondary | ICD-10-CM

## 2020-11-27 DIAGNOSIS — N946 Dysmenorrhea, unspecified: Secondary | ICD-10-CM

## 2020-11-27 DIAGNOSIS — Z1151 Encounter for screening for human papillomavirus (HPV): Secondary | ICD-10-CM

## 2020-11-27 DIAGNOSIS — R350 Frequency of micturition: Secondary | ICD-10-CM

## 2020-11-27 DIAGNOSIS — N920 Excessive and frequent menstruation with regular cycle: Secondary | ICD-10-CM

## 2020-11-27 DIAGNOSIS — Z01419 Encounter for gynecological examination (general) (routine) without abnormal findings: Secondary | ICD-10-CM | POA: Diagnosis not present

## 2020-11-27 DIAGNOSIS — N760 Acute vaginitis: Secondary | ICD-10-CM

## 2020-11-27 DIAGNOSIS — Z113 Encounter for screening for infections with a predominantly sexual mode of transmission: Secondary | ICD-10-CM

## 2020-11-27 DIAGNOSIS — B9689 Other specified bacterial agents as the cause of diseases classified elsewhere: Secondary | ICD-10-CM

## 2020-11-27 LAB — URINALYSIS, COMPLETE W/RFL CULTURE
Glucose, UA: NEGATIVE
Hyaline Cast: NONE SEEN /LPF
RBC / HPF: NONE SEEN /HPF (ref 0–2)
pH: 5.5 (ref 5.0–8.0)

## 2020-11-27 LAB — CBC
HCT: 37.9 % (ref 35.0–45.0)
Hemoglobin: 12.4 g/dL (ref 11.7–15.5)
MCH: 30.2 pg (ref 27.0–33.0)
MCHC: 32.7 g/dL (ref 32.0–36.0)
MCV: 92.2 fL (ref 80.0–100.0)
MPV: 9.9 fL (ref 7.5–12.5)
Platelets: 406 10*3/uL — ABNORMAL HIGH (ref 140–400)
RBC: 4.11 10*6/uL (ref 3.80–5.10)
RDW: 13.2 % (ref 11.0–15.0)
WBC: 5.2 10*3/uL (ref 3.8–10.8)

## 2020-11-27 MED ORDER — SULFAMETHOXAZOLE-TRIMETHOPRIM 800-160 MG PO TABS: 1.0000 | ORAL_TABLET | Freq: Two times a day (BID) | ORAL | 0 refills | Status: AC

## 2020-11-27 MED ORDER — VALACYCLOVIR HCL 500 MG PO TABS: ORAL_TABLET | ORAL | 4 refills | Status: DC

## 2020-11-27 MED ORDER — FLUCONAZOLE 150 MG PO TABS
150.0000 mg | ORAL_TABLET | Freq: Every day | ORAL | 1 refills | Status: AC
Start: 2020-11-27 — End: 2020-11-30

## 2020-11-27 MED ORDER — TINIDAZOLE 500 MG PO TABS: 1000.0000 mg | ORAL_TABLET | Freq: Two times a day (BID) | ORAL | 0 refills | Status: AC

## 2020-11-27 MED ORDER — NORETHINDRONE 0.35 MG PO TABS: 1.0000 | ORAL_TABLET | Freq: Every day | ORAL | 4 refills | Status: DC

## 2020-11-27 NOTE — Progress Notes (Signed)
Jessica Contreras March 15, 1971 [id number]   History:    50 y.o. G3P2A1L2  Long term relationship.  Has a grand-child.  RP:  Established patient presenting for annual gyn exam   HPI: Menses heavy with severe cramping every month.  The menstrual cramps are not controled with Ibuprofen.  No BTB.  C/O frequency of urination.  BMs normal.  Breasts normal.  BMI 42.87.  Health labs with Fam MD.  Past medical history,surgical history, family history and social history were all reviewed and documented in the EPIC chart.  Gynecologic History Patient's last menstrual period was 11/19/2020.  Obstetric History OB History  Gravida Para Term Preterm AB Living  3 2     1 2   SAB IAB Ectopic Multiple Live Births  1            # Outcome Date GA Lbr Len/2nd Weight Sex Delivery Anes PTL Lv  3 SAB           2 Para           1 Para              ROS: A ROS was performed and pertinent positives and negatives are included in the history.  GENERAL: No fevers or chills. HEENT: No change in vision, no earache, sore throat or sinus congestion. NECK: No pain or stiffness. CARDIOVASCULAR: No chest pain or pressure. No palpitations. PULMONARY: No shortness of breath, cough or wheeze. GASTROINTESTINAL: No abdominal pain, nausea, vomiting or diarrhea, melena or bright red blood per rectum. GENITOURINARY: No urinary frequency, urgency, hesitancy or dysuria. MUSCULOSKELETAL: No joint or muscle pain, no back pain, no recent trauma. DERMATOLOGIC: No rash, no itching, no lesions. ENDOCRINE: No polyuria, polydipsia, no heat or cold intolerance. No recent change in weight. HEMATOLOGICAL: No anemia or easy bruising or bleeding. NEUROLOGIC: No headache, seizures, numbness, tingling or weakness. PSYCHIATRIC: No depression, no loss of interest in normal activity or change in sleep pattern.     Exam:   BP 124/80   Ht 5\' 3"  (1.6 m)   Wt 242 lb (109.8 kg)   LMP 11/19/2020 Comment: tubal ligation   BMI 42.87 kg/m   Body  mass index is 42.87 kg/m.  General appearance : Well developed well nourished female. No acute distress HEENT: Eyes: no retinal hemorrhage or exudates,  Neck supple, trachea midline, no carotid bruits, no thyroidmegaly Lungs: Clear to auscultation, no rhonchi or wheezes, or rib retractions  Heart: Regular rate and rhythm, no murmurs or gallops Breast:Examined in sitting and supine position were symmetrical in appearance, no palpable masses or tenderness,  no skin retraction, no nipple inversion, no nipple discharge, no skin discoloration, no axillary or supraclavicular lymphadenopathy Abdomen: no palpable masses or tenderness, no rebound or guarding Extremities: no edema or skin discoloration or tenderness  Pelvic: Vulva: Normal             Vagina: No gross lesions or discharge  Cervix: No gross lesions or discharge.  Pap/HPV HR, Gono-Chlam done.  Uterus  AV, normal size, shape and consistency, mildly tender and mobile  Adnexa  Without masses or tenderness  Anus: Normal  U/A: Yellow cloudy, protein negative, nitrites negative, white blood cells 0-5, red blood cells negative, many bacteria, few clue cells.  Urine culture pending.   Assessment/Plan:  50 y.o. female for annual exam   1. Encounter for routine gynecological examination with Papanicolaou smear of cervix Gynecologic exam within normal limits, but mildly tender.  Bacterial vaginosis present.  Pap test with high-risk HPV done.  Breast exam normal.  Screening mammogram May 2021 was benign at [facility].  Fasting health labs with family physician.  2. Severe dysmenorrhea Severe dysmenorrhea progressively worsening.  Patient also experiences heavy menstrual flow.  Decision to investigate further with a pelvic ultrasound at follow-up.  Starting on the progestin only birth control pill to attempt controlling the dysmenorrhea and menorrhagia.  No contraindication to the progestin only birth control pill.  Usage reviewed and prescription sent  to pharmacy. - US Transvaginal Non-OB; Future  3. Menorrhagia with regular cycle Heavy menstrual flow.  Rule out anemia with a CBC today.  Further investigation at follow-up with a pelvic ultrasound.  Started on the progestin only birth control pill today. - [place] Transvaginal Non-OB; Future - CBC  4. Screen for STD (sexually transmitted disease) Gonorrhea and Chlamydia testing done on the cervix.  5. H/O herpes genitalis History of genital herpes on valacyclovir prophylaxis.  Prescription sent to pharmacy.  6. Frequency of urination Probable acute cystitis.  Decision to treat with Bactrim DS.  Usage reviewed and prescription sent to pharmacy.  Pending urine culture. - Urinalysis,Complete w/RFL Culture  7. Bacterial vaginosis Decision to treat with tinidazole.  Usage reviewed and prescription sent to pharmacy.  Will give fluconazole to treat or prevent yeast vaginitis post antibiotics.  Other orders - norethindrone (MICRONOR) 0.35 MG tablet; Take 1 tablet (0.35 mg total) by mouth daily. - valACYclovir (VALTREX) 500 MG tablet; Take one tab po daily. - tinidazole (TINDAMAX) 500 MG tablet; Take 2 tablets (1,000 mg total) by mouth 2 (two) times daily for 2 days. - sulfamethoxazole-trimethoprim (BACTRIM DS) 800-160 MG tablet; Take 1 tablet by mouth 2 (two) times daily for 3 days. - fluconazole (DIFLUCAN) 150 MG tablet; Take 1 tablet (150 mg total) by mouth daily for 3 days.  Genia Del MD, 9:56 AM 11/27/2020

## 2020-11-28 LAB — PAP IG, CT-NG NAA, HPV HIGH-RISK
C. trachomatis RNA, TMA: NOT DETECTED
HPV DNA High Risk: NOT DETECTED
N. gonorrhoeae RNA, TMA: NOT DETECTED

## 2020-11-28 LAB — URINALYSIS, COMPLETE W/RFL CULTURE
Bilirubin Urine: NEGATIVE
Hgb urine dipstick: NEGATIVE
Ketones, ur: NEGATIVE
Leukocyte Esterase: NEGATIVE
Nitrites, Initial: NEGATIVE
Protein, ur: NEGATIVE
Specific Gravity, Urine: 1.027 (ref 1.001–1.03)

## 2020-11-28 LAB — URINE CULTURE
MICRO NUMBER:: 11384881
SPECIMEN QUALITY:: ADEQUATE

## 2020-11-28 LAB — CULTURE INDICATED

## 2020-12-05 ENCOUNTER — Ambulatory Visit: Payer: 59 | Admitting: Obstetrics & Gynecology

## 2020-12-05 ENCOUNTER — Other Ambulatory Visit: Payer: 59

## 2021-11-23 DIAGNOSIS — D259 Leiomyoma of uterus, unspecified: Secondary | ICD-10-CM

## 2021-11-23 HISTORY — DX: Leiomyoma of uterus, unspecified: D25.9

## 2021-11-28 ENCOUNTER — Ambulatory Visit: Payer: 59 | Admitting: Obstetrics & Gynecology

## 2021-12-02 ENCOUNTER — Other Ambulatory Visit: Payer: Self-pay

## 2021-12-02 ENCOUNTER — Encounter: Payer: Self-pay | Admitting: Obstetrics & Gynecology

## 2021-12-02 ENCOUNTER — Ambulatory Visit (INDEPENDENT_AMBULATORY_CARE_PROVIDER_SITE_OTHER): Payer: 59 | Admitting: Obstetrics & Gynecology

## 2021-12-02 VITALS — BP 108/72 | HR 71 | Ht 63.25 in | Wt 246.0 lb

## 2021-12-02 DIAGNOSIS — Z3041 Encounter for surveillance of contraceptive pills: Secondary | ICD-10-CM | POA: Diagnosis not present

## 2021-12-02 DIAGNOSIS — R350 Frequency of micturition: Secondary | ICD-10-CM

## 2021-12-02 DIAGNOSIS — Z6841 Body Mass Index (BMI) 40.0 and over, adult: Secondary | ICD-10-CM

## 2021-12-02 DIAGNOSIS — N946 Dysmenorrhea, unspecified: Secondary | ICD-10-CM

## 2021-12-02 DIAGNOSIS — Z01419 Encounter for gynecological examination (general) (routine) without abnormal findings: Secondary | ICD-10-CM

## 2021-12-02 DIAGNOSIS — N920 Excessive and frequent menstruation with regular cycle: Secondary | ICD-10-CM | POA: Diagnosis not present

## 2021-12-02 DIAGNOSIS — D649 Anemia, unspecified: Secondary | ICD-10-CM

## 2021-12-02 HISTORY — DX: Anemia, unspecified: D64.9

## 2021-12-02 LAB — CBC
HCT: 29.9 % — ABNORMAL LOW (ref 35.0–45.0)
Hemoglobin: 9 g/dL — ABNORMAL LOW (ref 11.7–15.5)
MCH: 23.6 pg — ABNORMAL LOW (ref 27.0–33.0)
MCHC: 30.1 g/dL — ABNORMAL LOW (ref 32.0–36.0)
MCV: 78.3 fL — ABNORMAL LOW (ref 80.0–100.0)
MPV: 9.7 fL (ref 7.5–12.5)
Platelets: 495 10*3/uL — ABNORMAL HIGH (ref 140–400)
RBC: 3.82 10*6/uL (ref 3.80–5.10)
RDW: 16.5 % — ABNORMAL HIGH (ref 11.0–15.0)
WBC: 5.3 10*3/uL (ref 3.8–10.8)

## 2021-12-02 MED ORDER — NORETHINDRONE 0.35 MG PO TABS
1.0000 | ORAL_TABLET | Freq: Every day | ORAL | 4 refills | Status: DC
Start: 2021-12-02 — End: 2022-03-17

## 2021-12-02 NOTE — Progress Notes (Signed)
Jessica Contreras [birth year] [id number]   History:    51 y.o.G3P2A1L2  Long term relationship.  Has a grand-child.   RP:  Established patient presenting for annual gyn exam    HPI: Well on BCPs, but menses still heavy with severe cramping every month.  Taking Ibuprofen/Midol.  Missing 1 day of work every period.  No BTB.  No pain with IC.  Pap Neg 11/2020.  C/O frequency of urination and urgency. BMs normal.  Breasts normal.  Mammo Neg 03/2021. BMI 43.23. Health labs with Fam MD.  Jessica Contreras 05/2021.   Past medical history,surgical history, family history and social history were all reviewed and documented in the EPIC chart.  Gynecologic History Patient's last menstrual period was 11/21/2021 (approximate).  Obstetric History OB History  Gravida Para Term Preterm AB Living  3 2     1 2   SAB IAB Ectopic Multiple Live Births  1            # Outcome Date GA Lbr Len/2nd Weight Sex Delivery Anes PTL Lv  3 SAB           2 Para           1 Para              ROS: A ROS was performed and pertinent positives and negatives are included in the history.  GENERAL: No fevers or chills. HEENT: No change in vision, no earache, sore throat or sinus congestion. NECK: No pain or stiffness. CARDIOVASCULAR: No chest pain or pressure. No palpitations. PULMONARY: No shortness of breath, cough or wheeze. GASTROINTESTINAL: No abdominal pain, nausea, vomiting or diarrhea, melena or bright red blood per rectum. GENITOURINARY: No urinary frequency, urgency, hesitancy or dysuria. MUSCULOSKELETAL: No joint or muscle pain, no back pain, no recent trauma. DERMATOLOGIC: No rash, no itching, no lesions. ENDOCRINE: No polyuria, polydipsia, no heat or cold intolerance. No recent change in weight. HEMATOLOGICAL: No anemia or easy bruising or bleeding. NEUROLOGIC: No headache, seizures, numbness, tingling or weakness. PSYCHIATRIC: No depression, no loss of interest in normal activity or change in sleep pattern.     Exam:   BP  108/72    Pulse 71    Ht 5' 3.25" (1.607 m)    Wt 246 lb (111.6 kg)    LMP 11/21/2021 (Approximate)    SpO2 96%    BMI 43.23 kg/m   Body mass index is 43.23 kg/m.  General appearance : Well developed well nourished female. No acute distress HEENT: Eyes: no retinal hemorrhage or exudates,  Neck supple, trachea midline, no carotid bruits, no thyroidmegaly Lungs: Clear to auscultation, no rhonchi or wheezes, or rib retractions  Heart: Regular rate and rhythm, no murmurs or gallops Breast:Examined in sitting and supine position were symmetrical in appearance, no palpable masses or tenderness,  no skin retraction, no nipple inversion, no nipple discharge, no skin discoloration, no axillary or supraclavicular lymphadenopathy Abdomen: no palpable masses or tenderness, no rebound or guarding Extremities: no edema or skin discoloration or tenderness  Pelvic: Vulva: Normal             Vagina: No gross lesions or discharge  Cervix: No gross lesions or discharge  Uterus  AV, normal size, shape and consistency, non-tender and mobile  Adnexa  Without masses or tenderness  Anus: Normal   Assessment/Plan:  51 y.o. female for annual exam   1. Well female exam with routine gynecological exam Well on BCPs, but menses still heavy with severe  cramping every month.  Taking Ibuprofen/Midol.  Missing 1 day of work every period.  No BTB.  No pain with IC.  Pap Neg 11/2020.  C/O frequency of urination and urgency. BMs normal.  Breasts normal.  Mammo Neg 03/2021. BMI 43.23. Health labs with Fam MD.  Jessica Contreras 05/2021.  2. Encounter for surveillance of contraceptive pills Well on the BCPs, no CI to continue.  Periods are shorter, but still heavy with dysmenorrhea.  Prescription sent to pharmacy.    3. Menorrhagia with regular cycle Heavy menses with dysmeno.  Will check Hb with a CBC today.  Continue on supplements of Iron.  F/U with a Pelvic US to further investigate the Menorrhagia/Dysmenorrhea. - CBC - US  Transvaginal Non-OB; Future  4. Severe dysmenorrhea - US Transvaginal Non-OB; Future  5. Urinary frequency Mild disturbance in U/A.  Will wait on U. Culture to decide on treatment. - Urinalysis,Complete w/RFL Culture  6. Class 3 severe obesity due to excess calories with serious comorbidity and body mass index (BMI) of 40.0 to 44.9 in adult [facility]) Lower calorie/carb diet.  Increase fitness activities.  Other orders - FERREX 150 150 MG capsule; Take 150 mg by mouth daily. - norethindrone (MICRONOR) 0.35 MG tablet; Take 1 tablet (0.35 mg total) by mouth daily.   Princess Bruins MD, 8:57 AM 12/02/2021

## 2021-12-04 LAB — URINE CULTURE
MICRO NUMBER:: 12850245
SPECIMEN QUALITY:: ADEQUATE

## 2021-12-04 LAB — URINALYSIS, COMPLETE W/RFL CULTURE
Bilirubin Urine: NEGATIVE
Glucose, UA: NEGATIVE
Hyaline Cast: NONE SEEN /LPF
Ketones, ur: NEGATIVE
Leukocyte Esterase: NEGATIVE
Nitrites, Initial: NEGATIVE
Protein, ur: NEGATIVE
Specific Gravity, Urine: 1.02 (ref 1.001–1.035)
pH: 5.5 (ref 5.0–8.0)

## 2021-12-04 LAB — CULTURE INDICATED

## 2021-12-11 ENCOUNTER — Telehealth: Payer: Self-pay | Admitting: *Deleted

## 2021-12-11 NOTE — Telephone Encounter (Signed)
Patient was informed of negative urine culture,however she said her CBC from 12/02/21 flagged several abnormal numbers. Her hemoglobin at 9, she reports taking OTC iron ( she does not know the dose) patient asked if you could review CBC flagged numbers and let her know what to do if anything. Please advise

## 2021-12-12 DIAGNOSIS — N920 Excessive and frequent menstruation with regular cycle: Secondary | ICD-10-CM

## 2021-12-12 HISTORY — DX: Excessive and frequent menstruation with regular cycle: N92.0

## 2021-12-14 ENCOUNTER — Other Ambulatory Visit: Payer: Self-pay | Admitting: Obstetrics & Gynecology

## 2021-12-16 ENCOUNTER — Other Ambulatory Visit: Payer: Self-pay | Admitting: Obstetrics & Gynecology

## 2021-12-16 ENCOUNTER — Other Ambulatory Visit: Payer: Self-pay

## 2021-12-16 ENCOUNTER — Encounter: Payer: Self-pay | Admitting: Obstetrics & Gynecology

## 2021-12-16 ENCOUNTER — Ambulatory Visit (INDEPENDENT_AMBULATORY_CARE_PROVIDER_SITE_OTHER): Payer: 59

## 2021-12-16 ENCOUNTER — Ambulatory Visit: Payer: 59 | Admitting: Obstetrics & Gynecology

## 2021-12-16 VITALS — BP 120/64

## 2021-12-16 DIAGNOSIS — N946 Dysmenorrhea, unspecified: Secondary | ICD-10-CM

## 2021-12-16 DIAGNOSIS — D219 Benign neoplasm of connective and other soft tissue, unspecified: Secondary | ICD-10-CM | POA: Diagnosis not present

## 2021-12-16 DIAGNOSIS — N920 Excessive and frequent menstruation with regular cycle: Secondary | ICD-10-CM

## 2021-12-16 DIAGNOSIS — D500 Iron deficiency anemia secondary to blood loss (chronic): Secondary | ICD-10-CM

## 2021-12-16 NOTE — Telephone Encounter (Signed)
Annual exam was on 12/02/21

## 2021-12-16 NOTE — Progress Notes (Signed)
° ° °  Jessica Contreras [birth year] [id number]        51 y.o.  [id number]   RP: Menorrhagia/Dysmenorrhea for Pelvic US  HPI: Well on BCPs, but menses still heavy with severe cramping every month.  CBC last visit 12/02/2021 showed Hb at 9.0. Taking Ibuprofen/Midol.  Missing 1 day of work every period.  No BTB.  No pain with IC.    OB History  Gravida Para Term Preterm AB Living  3 2     1 2   SAB IAB Ectopic Multiple Live Births  1            # Outcome Date GA Lbr Len/2nd Weight Sex Delivery Anes PTL Lv  3 SAB           2 Para           1 Para             Past medical history,surgical history, problem list, medications, allergies, family history and social history were all reviewed and documented in the EPIC chart.   Directed ROS with pertinent positives and negatives documented in the history of present illness/assessment and plan.  Exam:  Vitals:   12/16/21 1451  BP: 120/64   General appearance:  Normal  Pelvic US today: T/V images.  Anteverted uterus normal in size and shape with multiple intramural and subserosal fibroids noted.  The overall uterine size is measured at 10.08 x 7.18 x 6.06 cm.  The largest fibroid is posterior measured at 2.6 x 2.2 cm.  Try layered symmetrical endometrium measured at 7.9 mm on day 26 of the cycle.  Possible submucosal fibroid at the fundal portion of the endometrial cavity measured at about 1 x 1.3 cm.  No abnormal thickening seen otherwise.  Right ovary is atrophic with normal appearance.  The left ovary presents a dominant follicle.  Positive perfusion to both ovaries.  No adnexal mass.  Trace amount of free fluid in the posterior cul-de-sac.   Assessment/Plan:  51 y.o. [id number]   1. Menorrhagia with regular cycle Well on BCPs, but menses still heavy with severe cramping every month.  CBC last visit 12/02/2021 showed Hb at 9.0.  Increase Iron supplement to twice a day. Taking Ibuprofen/Midol.  Missing 1 day of work every period.  No BTB.  No pain  with IC. Pelvic US findings thoroughly reviewed with patient.  Different medical/hormonal and conservative surgical approaches reviewed with patient. Given the severity of patient's symptoms including the dysmenorrhea and menorrhagia persisting on BCPs with secondary anemia, decision to proceed with Robotic TLH/BS.  F/U Preop visit.  2. Severe dysmenorrhea Continue with BCPs and Ibuprofen until Preop visit.  3. Fibroids Pelvic US findings reviewed with patient.  As above.  4. Iron deficiency anemia due to chronic blood loss  Increase Iron supplement to twice a day.  Princess Bruins MD, 3:28 PM 12/16/2021

## 2021-12-17 ENCOUNTER — Encounter: Payer: Self-pay | Admitting: Obstetrics & Gynecology

## 2021-12-18 ENCOUNTER — Telehealth: Payer: Self-pay

## 2021-12-18 NOTE — Telephone Encounter (Signed)
-----   Message from Princess Bruins, MD sent at 12/17/2021  9:44 AM EST ----- Regarding: Schedule Surgery Surgery: CPT 43601 - XI Robotic Total Laparoscopic Hysterectomy with Bilateral Salpingectomy  Diagnosis: N92.0 Menorrhagia with Regular Cycles, D25.9 Fibroids  Location: [place]  Status: Outpatient with Overnight Bed  Time: 90 Minutes  Assistant: First Available Provider  Urgency: First Available  Pre-Op Appointment: To Be Scheduled  Post-Op Appointment(s): 2 Weeks, 6 Weeks  Time Out Of Work: 6 Weeks

## 2021-12-19 NOTE — Telephone Encounter (Signed)
Left message to call Conchita Truxillo, RN at [facility], [phone].  

## 2021-12-23 NOTE — Telephone Encounter (Signed)
Left message to call Levoy Geisen, RN at [facility], [phone].  

## 2022-01-01 NOTE — Telephone Encounter (Signed)
Spoke with patient regarding surgery benefits. Patient acknowledges understanding of information presented. Patient is aware that benefits presented are professional benefits only. Patient is aware the hospital will call with facility benefits. See account note.  Routing to Glorianne Manchester, Therapist, sports, for surgery scheduling.

## 2022-01-02 NOTE — Telephone Encounter (Signed)
Spoke with patient. Reviewed surgery dates. Patient request to proceed with surgery on 03/17/22, patient declines earlier dates.  Advised patient I will forward to business office for return call. Patient verbalizes understanding and is agreeable.   Surgery request sent.

## 2022-01-13 NOTE — Telephone Encounter (Signed)
Surgery request sent.  

## 2022-01-13 NOTE — Telephone Encounter (Signed)
Spoke with patient. Surgery date request confirmed.  Advised surgery is scheduled for 03/17/22 at 0830.  Surgery instruction sheet and hospital brochure reviewed, printed copy will be mailed. Patient advised if Covid screening and quarantine requirements and agreeable.   Routing to provider. Encounter closed.  Cc: Hayley Carder

## 2022-02-10 ENCOUNTER — Telehealth: Payer: Self-pay | Admitting: *Deleted

## 2022-02-10 NOTE — Telephone Encounter (Signed)
Call to patient, no answer.  ?MyChart message to patient notifying of surgery time change, 0730 on 03/17/22 at [facility].  ?

## 2022-02-13 NOTE — Telephone Encounter (Signed)
Encounter closed

## 2022-02-21 DIAGNOSIS — N644 Mastodynia: Secondary | ICD-10-CM

## 2022-02-21 HISTORY — DX: Mastodynia: N64.4

## 2022-02-24 ENCOUNTER — Encounter (HOSPITAL_BASED_OUTPATIENT_CLINIC_OR_DEPARTMENT_OTHER): Payer: Self-pay | Admitting: Obstetrics & Gynecology

## 2022-02-26 ENCOUNTER — Encounter: Payer: Self-pay | Admitting: Obstetrics & Gynecology

## 2022-02-26 ENCOUNTER — Ambulatory Visit (INDEPENDENT_AMBULATORY_CARE_PROVIDER_SITE_OTHER): Payer: 59 | Admitting: Obstetrics & Gynecology

## 2022-02-26 VITALS — BP 118/74 | HR 70 | Temp 97.5°F | Resp 16 | Ht 63.0 in | Wt 246.0 lb

## 2022-02-26 DIAGNOSIS — N946 Dysmenorrhea, unspecified: Secondary | ICD-10-CM | POA: Diagnosis not present

## 2022-02-26 DIAGNOSIS — N920 Excessive and frequent menstruation with regular cycle: Secondary | ICD-10-CM

## 2022-02-26 DIAGNOSIS — D500 Iron deficiency anemia secondary to blood loss (chronic): Secondary | ICD-10-CM | POA: Diagnosis not present

## 2022-02-26 DIAGNOSIS — R350 Frequency of micturition: Secondary | ICD-10-CM

## 2022-02-26 DIAGNOSIS — D219 Benign neoplasm of connective and other soft tissue, unspecified: Secondary | ICD-10-CM

## 2022-02-26 LAB — URINALYSIS, COMPLETE W/RFL CULTURE
Bacteria, UA: NONE SEEN /HPF
Bilirubin Urine: NEGATIVE
Glucose, UA: NEGATIVE
Hgb urine dipstick: NEGATIVE
Hyaline Cast: NONE SEEN /LPF
Ketones, ur: NEGATIVE
Leukocyte Esterase: NEGATIVE
Nitrites, Initial: NEGATIVE
Protein, ur: NEGATIVE
RBC / HPF: NONE SEEN /HPF (ref 0–2)
Specific Gravity, Urine: 1.025 (ref 1.001–1.035)
WBC, UA: NONE SEEN /HPF (ref 0–5)
pH: 6 (ref 5.0–8.0)

## 2022-02-26 LAB — NO CULTURE INDICATED

## 2022-02-26 NOTE — Progress Notes (Signed)
? ? ?AERALYN BARNA 1971-06-25 [id number] ? ? ?     51 y.o.  [id number]  ?  ?RP: Preop Menorrhagia/Dysmenorrhea/Fibroids for XI Robotic TLH/BS ?  ?HPI: Well on BCPs, but menses still heavy with severe cramping every month.  CBC last visit 12/02/2021 showed Hb at 9.0. Taking Ibuprofen/Midol.  Missing 1 day of work every period.  No BTB.  No pain with IC.  ?  ? ? ?OB History  ?Gravida Para Term Preterm AB Living  ?'3 2     1 2  '$ ?SAB IAB Ectopic Multiple Live Births  ?1          ?  ?# Outcome Date GA Lbr Len/2nd Weight Sex Delivery Anes PTL Lv  ?3 SAB           ?2 Para           ?1 Para           ? ? ?Past medical history,surgical history, problem list, medications, allergies, family history and social history were all reviewed and documented in the EPIC chart. ? ? ?Directed ROS with pertinent positives and negatives documented in the history of present illness/assessment and plan. ? ?Exam: ? ?Vitals:  ? 02/26/22 1120  ?BP: 118/74  ?Pulse: 70  ?Resp: 16  ?Temp: (!) 97.5 ?F (36.4 ?C)  ?TempSrc: Oral  ?Weight: 246 lb (111.6 kg)  ?Height: '5\' 3"'$  (1.6 m)  ? ?General appearance:  Normal ? ?Pelvic US 12/16/2021: T/V images.  Anteverted uterus normal in size and shape with multiple intramural and subserosal fibroids noted.  The overall uterine size is measured at 10.08 x 7.18 x 6.06 cm.  The largest fibroid is posterior measured at 2.6 x 2.2 cm.  Try layered symmetrical endometrium measured at 7.9 mm on day 26 of the cycle.  Possible submucosal fibroid at the fundal portion of the endometrial cavity measured at about 1 x 1.3 cm.  No abnormal thickening seen otherwise.  Right ovary is atrophic with normal appearance.  The left ovary presents a dominant follicle. Positive perfusion to both ovaries.  No adnexal mass.  Trace amount of free fluid in the posterior cul-de-sac. ?  ? ?Assessment/Plan:  51 y.o. [id number]  ? ?1. Fibroids ?Well on BCPs, but menses still heavy with severe cramping every month.  CBC last visit 12/02/2021 showed Hb  at 9.0.  Increase Iron supplement to twice a day. Taking Ibuprofen/Midol.  Missing 1 day of work every period.  No BTB.  No pain with IC. Pelvic US findings thoroughly reviewed with patient.  Different medical/hormonal and conservative surgical approaches reviewed with patient. Given the severity of patient's symptoms including the dysmenorrhea and menorrhagia persisting on BCPs with secondary anemia, decision to proceed with XI Robotic TLH/BS.  Preop preparation, surgery and risks as well as postop expectations and precautions thoroughly reviewed with patient.  Risks as described below all reviewed. ?  ?2. Menorrhagia with regular cycle ?Continue on the Progestin pill until surgery. ? ?3. Iron deficiency anemia due to chronic blood loss ?Continue Iron supplements until surgery. ? ?4. Severe dysmenorrhea ?As above. ? ?5. Urinary frequency ?U/A Negative. ?- Urinalysis,Complete w/RFL Culture ? ?Other orders ?- CALCIUM PO; Take by mouth.  ?                      Patient was counseled as to the risk of surgery to include the following: ? ?1. Infection (prohylactic antibiotics will be administered) ? ?2. DVT/Pulmonary Embolism (prophylactic pneumo  compression stockings will be used) ? ?3.Trauma to internal organs requiring additional surgical procedure to repair any injury to internal organs requiring perhaps additional hospitalization days. ? ?4.Hemmorhage requiring transfusion and blood products which carry risks such as anaphylactic reaction, hepatitis and AIDS ? ?Patient had received literature information on the procedure scheduled and all her questions were answered and fully accepts all risk. ?  ?Princess Bruins MD, 11:48 AM 02/26/2022 ? ? ? ?  ?

## 2022-03-02 ENCOUNTER — Encounter (HOSPITAL_BASED_OUTPATIENT_CLINIC_OR_DEPARTMENT_OTHER): Payer: Self-pay | Admitting: Obstetrics & Gynecology

## 2022-03-02 ENCOUNTER — Other Ambulatory Visit: Payer: Self-pay

## 2022-03-02 NOTE — Progress Notes (Signed)
Spoke w/ via phone for pre-op interview---Jessica Contreras ?Lab needs dos----urine pregnancy               ?Lab results------03/13/22 lab appt for CBC, type & screen ?COVID test -----patient states asymptomatic no test needed ?Arrive at -------0530 on Tuesday, 03/17/22 ?NPO after MN NO Solid Food.  Clear liquids from MN until---0430 ?Med rec completed ?Medications to take morning of surgery -----Dexilant, Norethindrone, Valtrex ?Diabetic medication -----n/a ?Patient instructed no nail polish to be worn day of surgery ?Patient instructed to bring photo id and insurance card day of surgery ?Patient aware to have Driver (ride ) / caregiver    for 24 hours after surgery - partner, Jessica Contreras ?Patient Special Instructions -----Extended recovery instructions. ?Pre-Op special Istructions -----none ?Patient verbalized understanding of instructions that were given at this phone interview. ?Patient denies shortness of breath, chest pain, fever, cough at this phone interview.  ?

## 2022-03-02 NOTE — Progress Notes (Signed)
? ? Your procedure is scheduled on Tuesday, 03/17/22. ? Report to [facility] ? Call this number if you have problems the morning of surgery  :[phone]. ? ? [facility] [place].  WE ARE LOCATED IN THE [facility]. ? ?PLEASE BRING YOUR INSURANCE CARD AND PHOTO ID DAY OF SURGERY. ? ?ONLY 2 PEOPLE ARE ALLOWED IN  WAITING  ROOM.  ?                                   ? REMEMBER: ? DO NOT EAT FOOD, CANDY GUM OR MINTS  AFTER MIDNIGHT THE NIGHT BEFORE YOUR SURGERY . YOU MAY HAVE CLEAR LIQUIDS FROM MIDNIGHT THE NIGHT BEFORE YOUR SURGERY UNTIL  4:30 AM. NO CLEAR LIQUIDS AFTER   4:30 AM DAY OF SURGERY. ? ?YOU MAY  BRUSH YOUR TEETH MORNING OF SURGERY AND RINSE YOUR MOUTH OUT, NO CHEWING GUM CANDY OR MINTS. ? ? ? ? ?CLEAR LIQUID DIET ? ? ?Foods Allowed                                                                     Foods Excluded ? ?Coffee and tea, regular and decaf                             liquids that you cannot  ?Plain Jell-O                                                                   see through such as: ?Fruit ices (not with fruit pulp)                                     milk, soups, orange juice  ?Plain  Popsicles                                    All solid food ?Carbonated beverages, regular and diet                                    ?Cranberry, grape and apple juices ?Sports drinks like Gatorade ? ?Sample Menu ?Breakfast                                Lunch                                     Supper ?Cranberry juice                                           ?  Jell-O                                     Grape juice                           Apple juice ?Coffee or tea                        Jell-O                                      Popsicle ?                                               Coffee or tea                        Coffee or tea ? ?_____________________________________________________________________ ?  ? ? TAKE THESE MEDICATIONS  MORNING OF SURGERY:  ?Dexilant, Norethindrone, Valtrex ? ? ? UP TO 4 VISITORS  MAY VISIT IN THE EXTENDED RECOVERY ROOM UNTIL 800 PM ONLY.  1 VISITOR AGE 90 AND OVER MAY SPEND THE NIGHT AND MUST BE IN EXTENDED RECOVERY ROOM NO LATER THAN 800 PM . YOUR DISCHARGE TIME AFTER YOU SPEND THE NIGHT IS 900 AM THE MORNING AFTER YOUR SURGERY. ? ?YOU MAY PACK A SMALL OVERNIGHT BAG WITH TOILETRIES FOR YOUR OVERNIGHT STAY IF YOU WISH. ? ?YOUR PRESCRIPTION MEDICATIONS WILL BE PROVIDED DURING [place]. ? ? ?                                   ?DO NOT WEAR JEWERLY, MAKE UP. ?DO NOT WEAR LOTIONS, POWDERS, PERFUMES OR NAIL POLISH ON YOUR FINGERNAILS. TOENAIL POLISH IS OK TO WEAR. ?DO NOT SHAVE FOR 48 HOURS PRIOR TO DAY OF SURGERY. ?MEN MAY SHAVE FACE AND NECK. ?CONTACTS, GLASSES, OR DENTURES MAY NOT BE WORN TO SURGERY. ? ?REMEMBER: NO SMOKING, DRUGS OR ALCOHOL FOR 24 HOURS BEFORE YOUR SURGERY. ?                                   ?[place] [facility] IS NOT RESPONSIBLE  FOR ANY BELONGINGS.                                  ?                                  . ?          [place] - Preparing for Surgery ?Before surgery, you can play an important role.  Because skin is not sterile, your skin needs to be as free of germs as possible.  You can reduce the number of germs on your skin by washing with CHG (chlorahexidine gluconate) soap before surgery.  CHG is an antiseptic cleaner which kills germs and bonds with the skin  to continue killing germs even after washing. ?Please DO NOT use if you have an allergy to CHG or antibacterial soaps.  If your skin becomes reddened/irritated stop using the CHG and inform your nurse when you arrive at Short Stay. ?Do not shave (including legs and underarms) for at least 48 hours prior to the first CHG shower.  You may shave your face/neck. ?Please follow these instructions carefully: ? 1.  Shower with CHG Soap the night before surgery and the  morning of Surgery. ? 2.  If you choose to wash your hair,  wash your hair first as usual with your  normal  shampoo. ? 3.  After you shampoo, rinse your hair and body thoroughly to remove the  shampoo.                            ?4.  Use CHG as you would any other liquid soap.  You can apply chg directly  to the skin and wash  ?                    Gently with a scrungie or clean washcloth. ? 5.  Apply the CHG Soap to your body ONLY FROM THE NECK DOWN.   Do not use on face/ open      ?                     Wound or open sores. Avoid contact with eyes, ears mouth and genitals (private parts).  ?                     Production manager,  Genitals (private parts) with your normal soap. ?            6.  Wash thoroughly, paying special attention to the area where your surgery  will be performed. ? 7.  Thoroughly rinse your body with warm water from the neck down. ? 8.  DO NOT shower/wash with your normal soap after using and rinsing off  the CHG Soap. ?               9.  Pat yourself dry with a clean towel. ?           10.  Wear clean pajamas. ?           11.  Place clean sheets on your bed the night of your first shower and do not  sleep with pets. ?Day of Surgery : ?Do not apply any lotions/deodorants the morning of surgery.  Please wear clean clothes to the hospital/surgery center. ? ?IF YOU HAVE ANY SKIN IRRITATION OR PROBLEMS WITH THE SURGICAL SOAP, PLEASE GET A BAR OF GOLD DIAL SOAP AND SHOWER THE NIGHT BEFORE YOUR SURGERY AND THE MORNING OF YOUR SURGERY. PLEASE LET THE NURSE KNOW MORNING OF YOUR SURGERY IF YOU HAD ANY PROBLEMS WITH THE SURGICAL SOAP. ? ? ?________________________________________________________________________                  ?                                    ?  QUESTIONS CALL Izmael Duross PRE OP NURSE PHONE [phone].                                    ?

## 2022-03-13 ENCOUNTER — Encounter (HOSPITAL_COMMUNITY)
Admission: RE | Admit: 2022-03-13 | Discharge: 2022-03-13 | Disposition: A | Discharge status: Home / Self Care | Payer: 59 | Source: Ambulatory Visit | Attending: Obstetrics & Gynecology | Admitting: Obstetrics & Gynecology

## 2022-03-13 DIAGNOSIS — Z01818 Encounter for other preprocedural examination: Secondary | ICD-10-CM

## 2022-03-13 DIAGNOSIS — Z01812 Encounter for preprocedural laboratory examination: Secondary | ICD-10-CM | POA: Insufficient documentation

## 2022-03-13 LAB — CBC
HCT: 34.8 % — ABNORMAL LOW (ref 36.0–46.0)
Hemoglobin: 11.1 g/dL — ABNORMAL LOW (ref 12.0–15.0)
MCH: 27.5 pg (ref 26.0–34.0)
MCHC: 31.9 g/dL (ref 30.0–36.0)
MCV: 86.4 fL (ref 80.0–100.0)
Platelets: 392 10*3/uL (ref 150–400)
RBC: 4.03 MIL/uL (ref 3.87–5.11)
RDW: 20.4 % — ABNORMAL HIGH (ref 11.5–15.5)
WBC: 5.7 10*3/uL (ref 4.0–10.5)
nRBC: 0 % (ref 0.0–0.2)

## 2022-03-17 ENCOUNTER — Ambulatory Visit (HOSPITAL_BASED_OUTPATIENT_CLINIC_OR_DEPARTMENT_OTHER): Payer: 59 | Admitting: Anesthesiology

## 2022-03-17 ENCOUNTER — Encounter (HOSPITAL_BASED_OUTPATIENT_CLINIC_OR_DEPARTMENT_OTHER)
Admission: RE | Disposition: A | Discharge status: Home / Self Care | Payer: Self-pay | Source: Home / Self Care | Attending: Obstetrics & Gynecology

## 2022-03-17 ENCOUNTER — Other Ambulatory Visit: Payer: Self-pay

## 2022-03-17 ENCOUNTER — Encounter (HOSPITAL_BASED_OUTPATIENT_CLINIC_OR_DEPARTMENT_OTHER): Payer: Self-pay | Admitting: Obstetrics & Gynecology

## 2022-03-17 ENCOUNTER — Ambulatory Visit (HOSPITAL_BASED_OUTPATIENT_CLINIC_OR_DEPARTMENT_OTHER)
Admission: RE | Admit: 2022-03-17 | Discharge: 2022-03-17 | Disposition: A | Discharge status: Home / Self Care | Payer: 59 | Attending: Obstetrics & Gynecology | Admitting: Obstetrics & Gynecology

## 2022-03-17 DIAGNOSIS — D251 Intramural leiomyoma of uterus: Secondary | ICD-10-CM | POA: Diagnosis not present

## 2022-03-17 DIAGNOSIS — N920 Excessive and frequent menstruation with regular cycle: Secondary | ICD-10-CM | POA: Diagnosis present

## 2022-03-17 DIAGNOSIS — Z01818 Encounter for other preprocedural examination: Secondary | ICD-10-CM

## 2022-03-17 DIAGNOSIS — D500 Iron deficiency anemia secondary to blood loss (chronic): Secondary | ICD-10-CM | POA: Diagnosis not present

## 2022-03-17 DIAGNOSIS — D259 Leiomyoma of uterus, unspecified: Secondary | ICD-10-CM | POA: Diagnosis not present

## 2022-03-17 DIAGNOSIS — N72 Inflammatory disease of cervix uteri: Secondary | ICD-10-CM | POA: Diagnosis not present

## 2022-03-17 DIAGNOSIS — N946 Dysmenorrhea, unspecified: Secondary | ICD-10-CM | POA: Insufficient documentation

## 2022-03-17 DIAGNOSIS — N8003 Adenomyosis of the uterus: Secondary | ICD-10-CM | POA: Diagnosis not present

## 2022-03-17 DIAGNOSIS — N7011 Chronic salpingitis: Secondary | ICD-10-CM | POA: Insufficient documentation

## 2022-03-17 DIAGNOSIS — Z87891 Personal history of nicotine dependence: Secondary | ICD-10-CM | POA: Diagnosis not present

## 2022-03-17 DIAGNOSIS — Z9889 Other specified postprocedural states: Secondary | ICD-10-CM

## 2022-03-17 HISTORY — DX: Gastro-esophageal reflux disease without esophagitis: K21.9

## 2022-03-17 HISTORY — DX: Headache, unspecified: R51.9

## 2022-03-17 HISTORY — DX: Injury of conjunctiva and corneal abrasion without foreign body, unspecified eye, initial encounter: S05.00XA

## 2022-03-17 HISTORY — PX: ROBOTIC ASSISTED TOTAL HYSTERECTOMY: SHX6085

## 2022-03-17 HISTORY — DX: Cardiac murmur, unspecified: R01.1

## 2022-03-17 HISTORY — DX: Prediabetes: R73.03

## 2022-03-17 LAB — CBC
HCT: 33.3 % — ABNORMAL LOW (ref 36.0–46.0)
Hemoglobin: 10.8 g/dL — ABNORMAL LOW (ref 12.0–15.0)
MCH: 27.8 pg (ref 26.0–34.0)
MCHC: 32.4 g/dL (ref 30.0–36.0)
MCV: 85.8 fL (ref 80.0–100.0)
Platelets: 316 10*3/uL (ref 150–400)
RBC: 3.88 MIL/uL (ref 3.87–5.11)
RDW: 20.4 % — ABNORMAL HIGH (ref 11.5–15.5)
WBC: 14 10*3/uL — ABNORMAL HIGH (ref 4.0–10.5)
nRBC: 0 % (ref 0.0–0.2)

## 2022-03-17 LAB — TYPE AND SCREEN
ABO/RH(D): O POS
Antibody Screen: NEGATIVE

## 2022-03-17 LAB — ABO/RH: ABO/RH(D): O POS

## 2022-03-17 LAB — POCT PREGNANCY, URINE: Preg Test, Ur: NEGATIVE

## 2022-03-17 SURGERY — HYSTERECTOMY, TOTAL, ROBOT-ASSISTED
Anesthesia: General | Site: Pelvis | Laterality: Bilateral

## 2022-03-17 MED ORDER — OXYCODONE HCL 5 MG/5ML PO SOLN: 5.0000 mg | Freq: Once | ORAL | Status: DC | PRN

## 2022-03-17 MED ORDER — MIDAZOLAM HCL 2 MG/2ML IJ SOLN
INTRAMUSCULAR | Status: DC | PRN
Start: 2022-03-17 — End: 2022-03-17
  Administered 2022-03-17: 2 mg via INTRAVENOUS

## 2022-03-17 MED ORDER — SODIUM CHLORIDE 0.9 % IR SOLN
Status: DC | PRN
  Administered 2022-03-17: 900 mL

## 2022-03-17 MED ORDER — DEXAMETHASONE SODIUM PHOSPHATE 10 MG/ML IJ SOLN
INTRAMUSCULAR | Status: DC | PRN
  Administered 2022-03-17 (×2): 5 mg via INTRAVENOUS

## 2022-03-17 MED ORDER — SUCCINYLCHOLINE CHLORIDE 200 MG/10ML IV SOSY
PREFILLED_SYRINGE | INTRAVENOUS | Status: AC
  Filled 2022-03-17: qty 10

## 2022-03-17 MED ORDER — LACTATED RINGERS IV SOLN: INTRAVENOUS | Status: DC

## 2022-03-17 MED ORDER — ACETAMINOPHEN 325 MG PO TABS: 650.0000 mg | ORAL_TABLET | ORAL | Status: DC | PRN

## 2022-03-17 MED ORDER — SUCCINYLCHOLINE CHLORIDE 200 MG/10ML IV SOSY
PREFILLED_SYRINGE | INTRAVENOUS | Status: DC | PRN
  Administered 2022-03-17: 140 mg via INTRAVENOUS

## 2022-03-17 MED ORDER — KETOROLAC TROMETHAMINE 30 MG/ML IJ SOLN: 30.0000 mg | Freq: Once | INTRAMUSCULAR | Status: DC | PRN

## 2022-03-17 MED ORDER — ROCURONIUM BROMIDE 10 MG/ML (PF) SYRINGE
PREFILLED_SYRINGE | INTRAVENOUS | Status: DC | PRN
Start: 2022-03-17 — End: 2022-03-17
  Administered 2022-03-17 (×2): 10 mg via INTRAVENOUS
  Administered 2022-03-17: 60 mg via INTRAVENOUS

## 2022-03-17 MED ORDER — OXYCODONE-ACETAMINOPHEN 7.5-325 MG PO TABS: 1.0000 | ORAL_TABLET | Freq: Four times a day (QID) | ORAL | 0 refills | Status: AC | PRN

## 2022-03-17 MED ORDER — HYDROMORPHONE HCL 1 MG/ML IJ SOLN
0.2500 mg | INTRAMUSCULAR | Status: DC | PRN
  Administered 2022-03-17: 0.25 mg via INTRAVENOUS
  Administered 2022-03-17: 0.5 mg via INTRAVENOUS
  Administered 2022-03-17: 0.25 mg via INTRAVENOUS

## 2022-03-17 MED ORDER — SUGAMMADEX SODIUM 200 MG/2ML IV SOLN
INTRAVENOUS | Status: DC | PRN
  Administered 2022-03-17: 250 mg via INTRAVENOUS

## 2022-03-17 MED ORDER — CEFAZOLIN SODIUM-DEXTROSE 2-4 GM/100ML-% IV SOLN
2.0000 g | INTRAVENOUS | Status: AC
  Administered 2022-03-17: 2 g via INTRAVENOUS

## 2022-03-17 MED ORDER — SCOPOLAMINE 1 MG/3DAYS TD PT72
MEDICATED_PATCH | TRANSDERMAL | Status: AC
  Filled 2022-03-17: qty 1

## 2022-03-17 MED ORDER — OXYCODONE HCL 5 MG PO TABS: 5.0000 mg | ORAL_TABLET | Freq: Once | ORAL | Status: DC | PRN

## 2022-03-17 MED ORDER — PROPOFOL 10 MG/ML IV BOLUS
INTRAVENOUS | Status: AC
Start: 2022-03-17 — End: ?
  Filled 2022-03-17: qty 20

## 2022-03-17 MED ORDER — SUGAMMADEX SODIUM 500 MG/5ML IV SOLN
INTRAVENOUS | Status: AC
  Filled 2022-03-17: qty 5

## 2022-03-17 MED ORDER — FENTANYL CITRATE (PF) 100 MCG/2ML IJ SOLN
INTRAMUSCULAR | Status: DC | PRN
  Administered 2022-03-17 (×2): 50 ug via INTRAVENOUS

## 2022-03-17 MED ORDER — OXYCODONE-ACETAMINOPHEN 5-325 MG PO TABS
ORAL_TABLET | ORAL | Status: AC
  Filled 2022-03-17: qty 2

## 2022-03-17 MED ORDER — FENTANYL CITRATE (PF) 100 MCG/2ML IJ SOLN
INTRAMUSCULAR | Status: AC
Start: 2022-03-17 — End: ?
  Filled 2022-03-17: qty 2

## 2022-03-17 MED ORDER — CEFAZOLIN SODIUM-DEXTROSE 2-4 GM/100ML-% IV SOLN
INTRAVENOUS | Status: AC
  Filled 2022-03-17: qty 100

## 2022-03-17 MED ORDER — OXYCODONE-ACETAMINOPHEN 5-325 MG PO TABS
2.0000 | ORAL_TABLET | ORAL | Status: DC | PRN
  Administered 2022-03-17 (×2): 2 via ORAL

## 2022-03-17 MED ORDER — BUPIVACAINE HCL (PF) 0.25 % IJ SOLN
INTRAMUSCULAR | Status: DC | PRN
  Administered 2022-03-17: 15 mL

## 2022-03-17 MED ORDER — ROCURONIUM BROMIDE 10 MG/ML (PF) SYRINGE
PREFILLED_SYRINGE | INTRAVENOUS | Status: AC
  Filled 2022-03-17: qty 10

## 2022-03-17 MED ORDER — SCOPOLAMINE 1 MG/3DAYS TD PT72
MEDICATED_PATCH | TRANSDERMAL | Status: DC | PRN
  Administered 2022-03-17: 1 via TRANSDERMAL

## 2022-03-17 MED ORDER — KETOROLAC TROMETHAMINE 30 MG/ML IJ SOLN
INTRAMUSCULAR | Status: DC | PRN
  Administered 2022-03-17: 30 mg via INTRAVENOUS

## 2022-03-17 MED ORDER — STERILE WATER FOR IRRIGATION IR SOLN
Status: DC | PRN
  Administered 2022-03-17: 500 mL

## 2022-03-17 MED ORDER — HEMOSTATIC AGENTS (NO CHARGE) OPTIME
TOPICAL | Status: DC | PRN
  Administered 2022-03-17: 1

## 2022-03-17 MED ORDER — LIDOCAINE HCL (PF) 2 % IJ SOLN
INTRAMUSCULAR | Status: DC | PRN
  Administered 2022-03-17: 1.5 mg/kg/h via INTRADERMAL

## 2022-03-17 MED ORDER — PROPOFOL 10 MG/ML IV BOLUS
INTRAVENOUS | Status: DC | PRN
  Administered 2022-03-17: 40 mg via INTRAVENOUS
  Administered 2022-03-17: 160 mg via INTRAVENOUS

## 2022-03-17 MED ORDER — LIDOCAINE 2% (20 MG/ML) 5 ML SYRINGE
INTRAMUSCULAR | Status: DC | PRN
  Administered 2022-03-17: 100 mg via INTRAVENOUS

## 2022-03-17 MED ORDER — HYDROMORPHONE HCL 1 MG/ML IJ SOLN
INTRAMUSCULAR | Status: AC
  Filled 2022-03-17: qty 1

## 2022-03-17 MED ORDER — HYDROCODONE-ACETAMINOPHEN 5-325 MG PO TABS: 1.0000 | ORAL_TABLET | ORAL | Status: DC | PRN

## 2022-03-17 MED ORDER — MIDAZOLAM HCL 2 MG/2ML IJ SOLN
INTRAMUSCULAR | Status: AC
  Filled 2022-03-17: qty 2

## 2022-03-17 MED ORDER — PHENYLEPHRINE 80 MCG/ML (10ML) SYRINGE FOR IV PUSH (FOR BLOOD PRESSURE SUPPORT)
PREFILLED_SYRINGE | INTRAVENOUS | Status: AC
  Filled 2022-03-17: qty 10

## 2022-03-17 MED ORDER — ONDANSETRON HCL 4 MG/2ML IJ SOLN
INTRAMUSCULAR | Status: DC | PRN
  Administered 2022-03-17: 4 mg via INTRAVENOUS

## 2022-03-17 MED ORDER — ONDANSETRON HCL 4 MG/2ML IJ SOLN
INTRAMUSCULAR | Status: AC
  Filled 2022-03-17: qty 2

## 2022-03-17 MED ORDER — DEXAMETHASONE SODIUM PHOSPHATE 10 MG/ML IJ SOLN
INTRAMUSCULAR | Status: AC
  Filled 2022-03-17: qty 1

## 2022-03-17 MED ORDER — PROPOFOL 10 MG/ML IV BOLUS
INTRAVENOUS | Status: AC
  Filled 2022-03-17: qty 20

## 2022-03-17 MED ORDER — POVIDONE-IODINE 10 % EX SWAB
2.0000 "application " | Freq: Once | CUTANEOUS | Status: AC
  Administered 2022-03-17: 2 via TOPICAL

## 2022-03-17 MED ORDER — ONDANSETRON HCL 4 MG/2ML IJ SOLN: 4.0000 mg | Freq: Once | INTRAMUSCULAR | Status: DC | PRN

## 2022-03-17 MED ORDER — KETOROLAC TROMETHAMINE 30 MG/ML IJ SOLN
INTRAMUSCULAR | Status: AC
  Filled 2022-03-17: qty 1

## 2022-03-17 MED ORDER — LIDOCAINE HCL (PF) 2 % IJ SOLN
INTRAMUSCULAR | Status: AC
  Filled 2022-03-17: qty 5

## 2022-03-17 MED ORDER — IBUPROFEN 800 MG PO TABS: 800.0000 mg | ORAL_TABLET | Freq: Three times a day (TID) | ORAL | 1 refills | Status: AC | PRN

## 2022-03-17 SURGICAL SUPPLY — 69 items
ADH SKN CLS APL DERMABOND .7 (GAUZE/BANDAGES/DRESSINGS) ×1
APL SRG 38 LTWT LNG FL B (MISCELLANEOUS) ×1
APPLICATOR ARISTA FLEXITIP XL (MISCELLANEOUS) ×1 IMPLANT
BARRIER ADHS 3X4 INTERCEED (GAUZE/BANDAGES/DRESSINGS) IMPLANT
BRR ADH 4X3 ABS CNTRL BYND (GAUZE/BANDAGES/DRESSINGS)
CATH FOLEY 3WAY  5CC 16FR (CATHETERS) ×2
CATH FOLEY 3WAY 5CC 16FR (CATHETERS) ×2 IMPLANT
COVER BACK TABLE 60X90IN (DRAPES) ×3 IMPLANT
COVER TIP SHEARS 8 DVNC (MISCELLANEOUS) ×2 IMPLANT
COVER TIP SHEARS 8MM DA VINCI (MISCELLANEOUS) ×2
DECANTER SPIKE VIAL GLASS SM (MISCELLANEOUS) ×6 IMPLANT
DEFOGGER SCOPE WARMER CLEARIFY (MISCELLANEOUS) ×3 IMPLANT
DERMABOND ADVANCED (GAUZE/BANDAGES/DRESSINGS) ×1
DERMABOND ADVANCED .7 DNX12 (GAUZE/BANDAGES/DRESSINGS) ×2 IMPLANT
DRAPE ARM DVNC X/XI (DISPOSABLE) ×8 IMPLANT
DRAPE COLUMN DVNC XI (DISPOSABLE) ×2 IMPLANT
DRAPE DA VINCI XI ARM (DISPOSABLE) ×8
DRAPE DA VINCI XI COLUMN (DISPOSABLE) ×2
DRAPE UTILITY XL STRL (DRAPES) ×3 IMPLANT
DURAPREP 26ML APPLICATOR (WOUND CARE) ×3 IMPLANT
ELECT REM PT RETURN 9FT ADLT (ELECTROSURGICAL) ×2
ELECTRODE REM PT RTRN 9FT ADLT (ELECTROSURGICAL) ×2 IMPLANT
GAUZE 4X4 16PLY ~~LOC~~+RFID DBL (SPONGE) ×6 IMPLANT
GAUZE VASELINE 3X9 (GAUZE/BANDAGES/DRESSINGS) ×3 IMPLANT
GLOVE BIO SURGEON STRL SZ 6.5 (GLOVE) ×9 IMPLANT
GLOVE BIO SURGEON STRL SZ8 (GLOVE) IMPLANT
GLOVE BIOGEL PI IND STRL 7.0 (GLOVE) ×10 IMPLANT
GLOVE BIOGEL PI IND STRL 8 (GLOVE) IMPLANT
GLOVE BIOGEL PI INDICATOR 7.0 (GLOVE) ×5
GLOVE BIOGEL PI INDICATOR 8 (GLOVE)
HEMOSTAT ARISTA ABSORB 3G PWDR (HEMOSTASIS) ×1 IMPLANT
HOLDER FOLEY CATH W/STRAP (MISCELLANEOUS) IMPLANT
IRRIG SUCT STRYKERFLOW 2 WTIP (MISCELLANEOUS) ×2
IRRIGATION SUCT STRKRFLW 2 WTP (MISCELLANEOUS) ×2 IMPLANT
KIT TURNOVER CYSTO (KITS) ×3 IMPLANT
LEGGING LITHOTOMY PAIR STRL (DRAPES) ×3 IMPLANT
OBTURATOR OPTICAL STANDARD 8MM (TROCAR) ×2
OBTURATOR OPTICAL STND 8 DVNC (TROCAR) ×1
OBTURATOR OPTICALSTD 8 DVNC (TROCAR) ×2 IMPLANT
OCCLUDER COLPOPNEUMO (BALLOONS) ×3 IMPLANT
PACK ROBOT WH (CUSTOM PROCEDURE TRAY) ×3 IMPLANT
PACK ROBOTIC GOWN (GOWN DISPOSABLE) ×3 IMPLANT
PACK TRENDGUARD 450 HYBRID PRO (MISCELLANEOUS) IMPLANT
PAD OB MATERNITY 4.3X12.25 (PERSONAL CARE ITEMS) ×3 IMPLANT
PAD PREP 24X48 CUFFED NSTRL (MISCELLANEOUS) ×3 IMPLANT
POUCH ENDO CATCH II 15MM (MISCELLANEOUS) IMPLANT
PROTECTOR NERVE ULNAR (MISCELLANEOUS) ×6 IMPLANT
RTRCTR WOUND ALEXIS 18CM SML (INSTRUMENTS)
SAVER CELL AAL HAEMONETICS (INSTRUMENTS) IMPLANT
SEAL CANN UNIV 5-8 DVNC XI (MISCELLANEOUS) ×8 IMPLANT
SEAL XI 5MM-8MM UNIVERSAL (MISCELLANEOUS) ×8
SET IRRIG Y TYPE TUR BLADDER L (SET/KITS/TRAYS/PACK) IMPLANT
SET TRI-LUMEN FLTR TB AIRSEAL (TUBING) ×3 IMPLANT
SPONGE T-LAP 4X18 ~~LOC~~+RFID (SPONGE) IMPLANT
SUT ETHIBOND 0 (SUTURE) IMPLANT
SUT VIC AB 4-0 PS2 27 (SUTURE) ×9 IMPLANT
SUT VICRYL 0 UR6 27IN ABS (SUTURE) ×3 IMPLANT
SUT VLOC 180 0 9IN  GS21 (SUTURE) ×2
SUT VLOC 180 0 9IN GS21 (SUTURE) ×2 IMPLANT
SUT VLOC 180 2-0 6IN GS21 (SUTURE) IMPLANT
TIP RUMI ORANGE 6.7MMX12CM (TIP) IMPLANT
TIP UTERINE 5.1X6CM LAV DISP (MISCELLANEOUS) IMPLANT
TIP UTERINE 6.7X10CM GRN DISP (MISCELLANEOUS) ×1 IMPLANT
TIP UTERINE 6.7X6CM WHT DISP (MISCELLANEOUS) IMPLANT
TIP UTERINE 6.7X8CM BLUE DISP (MISCELLANEOUS) IMPLANT
TOWEL OR 17X26 10 PK STRL BLUE (TOWEL DISPOSABLE) ×3 IMPLANT
TRENDGUARD 450 HYBRID PRO PACK (MISCELLANEOUS)
TROCAR PORT AIRSEAL 8X100 (TROCAR) ×3 IMPLANT
WATER STERILE IRR 1000ML POUR (IV SOLUTION) ×3 IMPLANT

## 2022-03-17 NOTE — H&P (Addendum)
?Jessica Contreras is an 51 y.o. female. [id number]  ?  ?RP: Menorrhagia/Dysmenorrhea/Fibroids for XI Robotic TLH/BS ?  ?HPI: Not currently bleeding, just finished her period.  Well on BCPs, but menses still heavy with severe cramping every month.  CBC last visit 12/02/2021 showed Hb at 9.0. Taking Ibuprofen/Midol.  Missing 1 day of work every period.  No BTB.  No pain with IC.  ? ?Pertinent Gynecological History: ?Menses:  Menometrorrhagia ?Contraception: tubal ligation ?Blood transfusions: none ?Previous GYN Procedures:  Tubal Ligation   ?Last mammogram: normal  ?Last pap: normal   ? ?Menstrual History: ?Patient's last menstrual period was 03/05/2022 (approximate). ?  ? ?Past Medical History:  ?Diagnosis Date  ? Anemia 12/02/2021  ? Hgb 9.0  ? 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 Breast pain, left 02/2022  ? not associated with sob or exertion, pt states she feels like the pain is in her breast  ? Chest wall pain 12/12/2018  ? EKG, chest x-ray and troponin negative. See MD note in Epic from 12/12/18 ED visit.  ? GERD (gastroesophageal reflux disease)   ? Gout 02-10-17  ? right foot, pt had injury to her foot, was diagnosed with gout, but pt is unsure that she really had gout  ? Headache   ? hx of migraines when pt was in her 02/11/23, still gets migraines occasionally  ? Heart murmur   ? as a child  ? HSV (herpes simplex virus) anogenital infection 08/2019  ? Upper buttocks  ? Hypertension   ? was on meds, but no longer needs per pt as of 03/02/22  ? Menorrhagia 12/12/2021  ? 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 after sister died  ? Pre-diabetes   ? Scratch of cornea   ? left eye, no problems, was told by eye doctor  ? Uterine fibroid 11/2021  ? Viral illness 07/2019  ? Pt stated that she had covid symptoms, fever, body aches, cought etc. She stated that she was sick for over a month and visited the doctor mutilple time. She tested negative for COVID mutiple times.  ? ? ?Past Surgical History:  ?Procedure Laterality Date  ? TUBAL LIGATION  1997  ? POST  PARTUM  ? WISDOM TOOTH EXTRACTION    ? when patient was in late 02-11-23 or early 02/11/23  ? ? ?Family History  ?Problem Relation Age of Onset  ? Sarcoidosis Mother   ? Cancer Maternal Aunt   ?     LUNG  ? Diabetes Maternal Grandmother   ? Hypertension Maternal Grandmother   ? ? ?Social History:  reports that she has quit smoking. Her smoking use included cigars. She has never used smokeless tobacco. She reports current alcohol use. She reports that she does not currently use drugs. ? ?Allergies:  ?Allergies  ?Allergen Reactions  ? Morphine And Related Itching  ? Latex Rash  ?  When exposed to latex for a long time per pt  ? ? ?Medications Prior to Admission  ?Medication Sig Dispense Refill Last Dose  ? CALCIUM PO Take by mouth.   03/16/2022  ? dexlansoprazole (DEXILANT) 60 MG capsule Take 1 tablet by mouth daily.   Past Month  ? FERREX 150 150 MG capsule Take 150 mg by mouth daily.   03/17/2022 at 0425  ? ibuprofen (ADVIL) 200 MG tablet Take 800 mg by mouth every 6 (six) hours as needed.   Past Week  ? norethindrone (MICRONOR) 0.35 MG tablet Take 1 tablet (0.35 mg total) by  mouth daily. 84 tablet 4 03/17/2022 at 0425  ? valACYclovir (VALTREX) 500 MG tablet TAKE 1 TABLET BY MOUTH DAILY 90 tablet 0 03/17/2022 at 0425  ? ? ?REVIEW OF SYSTEMS: A ROS was performed and pertinent positives and negatives are included in the history. ? GENERAL: No fevers or chills. HEENT: No change in vision, no earache, sore throat or sinus congestion. NECK: No pain or stiffness. CARDIOVASCULAR: No chest pain or pressure. No palpitations. PULMONARY: No shortness of breath, cough or wheeze. GASTROINTESTINAL: No abdominal pain, nausea, vomiting or diarrhea, melena or bright red blood per rectum. GENITOURINARY: No urinary frequency, urgency, hesitancy or dysuria. MUSCULOSKELETAL: No joint or muscle pain, no back pain, no recent trauma. DERMATOLOGIC: No rash, no itching, no lesions. ENDOCRINE: No polyuria, polydipsia, no heat or cold intolerance. No  recent change in weight. HEMATOLOGICAL: No anemia or easy bruising or bleeding. NEUROLOGIC: No headache, seizures, numbness, tingling or weakness. PSYCHIATRIC: No depression, no loss of interest in normal activity or change in sleep pattern.  ? ? ? ?Blood pressure 139/84, pulse 64, temperature 98.5 ?F (36.9 ?C), temperature source Oral, resp. rate 20, height '5\' 3"'$  (1.6 m), weight 112.5 kg, last menstrual period 03/05/2022, SpO2 99 %. ? ?Physical Exam: ? ?RCR ?Lungs clear ? ?Results for orders placed or performed during the hospital encounter of 03/17/22 (from the past 24 hour(s))  ?Pregnancy, urine POC     Status: None  ? Collection Time: 03/17/22  6:42 AM  ?Result Value Ref Range  ? Preg Test, Ur NEGATIVE NEGATIVE  ? ?Pelvic US 12/16/2021: T/V images.  Anteverted uterus normal in size and shape with multiple intramural and subserosal fibroids noted.  The overall uterine size is measured at 10.08 x 7.18 x 6.06 cm.  The largest fibroid is posterior measured at 2.6 x 2.2 cm.  Try layered symmetrical endometrium measured at 7.9 mm on day 26 of the cycle.  Possible submucosal fibroid at the fundal portion of the endometrial cavity measured at about 1 x 1.3 cm.  No abnormal thickening seen otherwise.  Right ovary is atrophic with normal appearance.  The left ovary presents a dominant follicle. Positive perfusion to both ovaries.  No adnexal mass.  Trace amount of free fluid in the posterior cul-de-sac. ?  ?  ?Assessment/Plan:  51 y.o. [id number]  ?  ?1. Fibroids ?Well on BCPs, but menses still heavy with severe cramping every month.  CBC last visit 12/02/2021 showed Hb at 9.0.  Increase Iron supplement to twice a day. Taking Ibuprofen/Midol.  Missing 1 day of work every period.  No BTB.  No pain with IC. Pelvic US findings thoroughly reviewed with patient.  Different medical/hormonal and conservative surgical approaches reviewed with patient. Given the severity of patient's symptoms including the dysmenorrhea and menorrhagia  persisting on BCPs with secondary anemia, decision to proceed with XI Robotic TLH/BS.  Preop preparation, surgery and risks as well as postop expectations and precautions thoroughly reviewed with patient.  Risks as described below all reviewed. ?  ?2. Menorrhagia with regular cycle ?Continue on the Progestin pill until surgery. ?  ?3. Iron deficiency anemia due to chronic blood loss ?Continue Iron supplements until surgery. ?  ?4. Severe dysmenorrhea ?As above. ?  ?                      Patient was counseled as to the risk of surgery to include the following: ?  ?1. Infection (prohylactic antibiotics will be administered) ?  ?2.  DVT/Pulmonary Embolism (prophylactic pneumo compression stockings will be used) ?  ?3.Trauma to internal organs requiring additional surgical procedure to repair any injury to internal organs requiring perhaps additional hospitalization days. ?  ?4.Hemmorhage requiring transfusion and blood products which carry risks such as anaphylactic reaction, hepatitis and AIDS ?  ?Patient had received literature information on the procedure scheduled and all her questions were answered and fully accepts all risk. ?  ? ?Marie-Lyne Keegan Bensch ?03/17/2022, 8:07 AM ?  ?

## 2022-03-17 NOTE — Progress Notes (Signed)
Dr Dellis Filbert rounding on patient to evaluate for discharge. ?

## 2022-03-17 NOTE — Op Note (Addendum)
Operative Note ? ?03/17/2022 ? ?11:26 AM ? ?PATIENT:  Jessica Contreras  51 y.o. female ? ?PRE-OPERATIVE DIAGNOSIS:  Menorrhagia with regular cycles, secondary anemia, fibroids, dysmenorrhea ? ?POST-OPERATIVE DIAGNOSIS:  Menorrhagia with regular cycles, secondary anemia, fibroids, dysmenorrhea ? ?PROCEDURE:  Procedure(s): ?XI ROBOTIC ASSISTED TOTAL Laparoscopic HYSTERECTOMY with bilateral Salpingectomy ? ?SURGEON:  Surgeon(s): ?Princess Bruins, MD ? ?ANESTHESIA:   general ? ?FINDINGS: Uterus with Fibroids.  Status post bilateral Tubal Ligation with filmy adhesions.  Normal bilateral Ovaries. ? ?DESCRIPTION OF OPERATION: Under general anesthesia with endotracheal intubation, the patient is in lithotomy position.  She is prepped with DuraPrep on the abdomen and with Betadine on the suprapubic, vulvar and vaginal areas.  She is draped as usual.  Timeout is done.  Ancef 2 g IV is given.  The gynecologic exam reveals an anteverted uterus about 10 cm nodular and mobile.  No adnexal mass.  The weighted speculum is inserted in the vagina and the anterior lip of the cervix is grasped with a tenaculum.  Hysterometry is at 10 cm.  A #10 Rumi with the medium Koh ring are put in place easily.  The other instruments are removed.  The Foley is inserted in the bladder.  We go to the abdomen. ?     The supraumbilical area is infiltrated with Marcaine one quarter plain.  A 1.5 cm incision is made with the scalp L at that level.  The aponeurosis is grasped with 2 Coker's and opened with Mayo scissors under direct vision.  The parietal peritoneum is opened bluntly with the finger.  A pursestring stitch of Vicryl 0 is done on the aponeurosis.  The Sheryle Hail is inserted under direct vision and up pneumoperitoneum is created with CO2.  The camera is inserted at that level and inspection reveals a free abdominal wall for port insertions.  Small uterine fibroids are present, the uterus is about 10 cm, bilateral tubes status post tubal  ligation with filmy adhesions and normal bilateral ovaries.  The liver is also normal in appearance.  The skin is marked with a pen at the port sites and infiltrated with Marcaine one quarter plain.  Small incisions are made with the scalpel.  All ports are inserted under direct vision with 2 robotic ports in line with the umbilicus on the right and 1 robotic port distally in line with the umbilicus on the left.  The assistant port is an 8 mm port inserted medially on the left side.  The patient is positioned in 30 degree Trendelenburg.  The robot is docked as usual.  The robotic instruments are inserted under direct vision with the fenestrated clamp in the fourth arm, the scissors in the third arm, the camera in the second arm and the bipolar in the first arm.  We go to the console. ?   Inspection of the pelvis reveals a uterus with fibroids, bilateral tubes status post tubal ligation and normal bilateral ovaries.  Filmy adhesions are present bilaterally at the adnexa.  Both ureters are seen in normal anatomy position with good peristalsis.  We start on the right side with cauterization and section of the right mesosalpinx.  The distal portion of the right tube is too adherent to the ovary to be removed that is therefore cauterized.  We then cauterized and section the right utero-ovarian ligament.  We cauterized and sectioned the right round ligament.  We open the broad ligament close to the uterus.  The visceral peritoneum is opened to the midline and  we start lowering the bladder.  We proceeded the same way on the left side.  The filmy adhesions are lysed.  We completed the opening of the visceral peritoneum and lowered the bladder well below the [facility] ring.  We then cauterized the blood flow on both sides of the uterus.  We cauterized the right uterine artery close to the junction between the uterus and the cervix.  We cauterized the left uterine artery at the junction of the uterus and cervix.  We section.  We  then cauterized again and section the right uterine artery.  The vaginal occluder is filled.  We used the point of the scissors to open the vagina as high as possible over the [place] ring anteriorly.  We then opened the vagina posteriorly on the Koh ring and finish on either side.  The specimen consisting of bilateral tube uterus and cervix is passed vaginally and sent to pathology.  The occluder is put back in place vaginally.  We changed the instrument to the cutting needle driver in the third arm and the long tip in the first arm.  We used a V-Loc 0 at 9 inches to close the vaginal vault.  We started at the right angle of the vagina and closed with a running suture all the way to the left angle and then back to the midline.  The needle is removed from the abdominopelvic cavity.  Hemostasis is adequate.  Good peristalsis is visualized bilaterally at the level of the ureters.  The urine is clear.  The robotic instruments are removed under direct vision.  The robot is undocked.  We go to laparoscopy time.  The Trendelenburg is decreased and the fluid is suctioned.  Good hemostasis is confirmed.  Hemostatic agent is added.  We removed the laparoscopic instruments under direct vision.  The ports are removed under direct vision.  The CO2 is evacuated.  The pursestring stitch is attached at the aponeurosis at the supraumbilical incision.  All incisions are closed with a subcuticular suture of Vicryl 4-0.  Dermabond is added on all incisions.  The occluder is removed from the vagina.  The patient is brought to recovery room in good and stable status. ? ?ESTIMATED BLOOD LOSS: 25 mL ? ? ?Intake/Output Summary (Last 24 hours) at 03/17/2022 1126 ?Last data filed at 03/17/2022 1111 ?Gross per 24 hour  ?Intake 1200 ml  ?Output 225 ml  ?Net 975 ml  ?  ? ?BLOOD ADMINISTERED:none  ? ?LOCAL MEDICATIONS USED:  MARCAINE    ? ?SPECIMEN:  Source of Specimen:  Uterus with cervix and bilateral tubes. ? ?DISPOSITION OF SPECIMEN:   PATHOLOGY ? ?COUNTS:  YES ? ?PLAN OF CARE: Transfer to PACU ? ?Marie-Lyne LavoieMD11:26 AM ? ? ?   ?

## 2022-03-17 NOTE — Transfer of Care (Signed)
Immediate Anesthesia Transfer of Care Note ? ?Patient: Jessica Contreras ? ?Procedure(s) Performed: Procedure(s) (LRB): ?XI ROBOTIC ASSISTED TOTAL Laparoscopic HYSTERECTOMY with bilateral salpingectomy (Bilateral) ? ?Patient Location: PACU ? ?Anesthesia Type: General ? ?Level of Consciousness: awake, oriented, sedated and patient cooperative ? ?Airway & Oxygen Therapy: Patient Spontanous Breathing and Patient connected to face mask oxygen ? ?Post-op Assessment: Report given to PACU RN and Post -op Vital signs reviewed and stable ? ?Post vital signs: Reviewed and stable ? ?Complications: No apparent anesthesia complications ?Last Vitals:  ?Vitals Value Taken Time  ?BP 142/82 03/17/22 1115  ?Temp    ?Pulse 65 03/17/22 1116  ?Resp 25 03/17/22 1116  ?SpO2 93 % 03/17/22 1116  ?Vitals shown include unvalidated device data. ? ?Last Pain:  ?Vitals:  ? 03/17/22 0720  ?TempSrc: Oral  ?PainSc: 2   ?   ? ?Patients Stated Pain Goal: 6 (03/17/22 0720) ? ?Complications: No notable events documented. ?

## 2022-03-17 NOTE — Anesthesia Postprocedure Evaluation (Signed)
Anesthesia Post Note ? ?Patient: TYRIHANNA WINGERT ? ?Procedure(s) Performed: XI ROBOTIC ASSISTED TOTAL Laparoscopic HYSTERECTOMY with bilateral salpingectomy (Bilateral: Pelvis) ? ?  ? ?Patient location during evaluation: PACU ?Anesthesia Type: General ?Level of consciousness: awake and alert ?Pain management: pain level controlled ?Vital Signs Assessment: post-procedure vital signs reviewed and stable ?Respiratory status: spontaneous breathing, nonlabored ventilation, respiratory function stable and patient connected to nasal cannula oxygen ?Cardiovascular status: blood pressure returned to baseline and stable ?Postop Assessment: no apparent nausea or vomiting ?Anesthetic complications: no ? ? ?No notable events documented. ? ?Last Vitals:  ?Vitals:  ? 03/17/22 1130 03/17/22 1145  ?BP: 139/84 (!) 151/77  ?Pulse: 66 61  ?Resp: 20 (!) 24  ?Temp:    ?SpO2: 94% 96%  ?  ?Last Pain:  ?Vitals:  ? 03/17/22 1130  ?TempSrc:   ?PainSc: Asleep  ? ? ?  ?  ?  ?  ?  ?  ? ?Daxton Nydam S ? ? ? ? ?

## 2022-03-17 NOTE — Anesthesia Preprocedure Evaluation (Signed)
Anesthesia Evaluation  ?Patient identified by MRN, date of birth, ID band ?Patient awake ? ? ? ?Reviewed: ?Allergy & Precautions, NPO status , Patient's Chart, lab work & pertinent test results ? ?Airway ?Mallampati: II ? ?TM Distance: >3 FB ?Neck ROM: Full ? ? ? Dental ?no notable dental hx. ? ?  ?Pulmonary ?neg pulmonary ROS, former smoker,  ?  ?Pulmonary exam normal ?breath sounds clear to auscultation ? ? ? ? ? ? Cardiovascular ?Normal cardiovascular exam ?Rhythm:Regular Rate:Normal ? ? ?  ?Neuro/Psych ?negative neurological ROS ? negative psych ROS  ? GI/Hepatic ?negative GI ROS, Neg liver ROS,   ?Endo/Other  ?Morbid obesity ? Renal/GU ?negative Renal ROS  ?negative genitourinary ?  ?Musculoskeletal ?negative musculoskeletal ROS ?(+)  ? Abdominal ?  ?Peds ?negative pediatric ROS ?(+)  Hematology ? ?(+) Blood dyscrasia, anemia ,   ?Anesthesia Other Findings ? ? Reproductive/Obstetrics ?negative OB ROS ? ?  ? ? ? ? ? ? ? ? ? ? ? ? ? ?  ?  ? ? ? ? ? ? ? ? ?Anesthesia Physical ?Anesthesia Plan ? ?ASA: 2 ? ?Anesthesia Plan: General  ? ?Post-op Pain Management: Dilaudid IV  ? ?Induction: Intravenous ? ?PONV Risk Score and Plan: 3 and Ondansetron, Dexamethasone, Scopolamine patch - Pre-op, Midazolam and Treatment may vary due to age or medical condition ? ?Airway Management Planned: Oral ETT ? ?Additional Equipment:  ? ?Intra-op Plan:  ? ?Post-operative Plan: Extubation in OR ? ?Informed Consent: I have reviewed the patients History and Physical, chart, labs and discussed the procedure including the risks, benefits and alternatives for the proposed anesthesia with the patient or authorized representative who has indicated his/her understanding and acceptance.  ? ? ? ?Dental advisory given ? ?Plan Discussed with: CRNA and Surgeon ? ?Anesthesia Plan Comments:   ? ? ? ? ? ? ?Anesthesia Quick Evaluation ? ?

## 2022-03-17 NOTE — Anesthesia Procedure Notes (Signed)
Procedure Name: Intubation ?Date/Time: 03/17/2022 8:24 AM ?Performed by: Suan Halter, CRNA ?Pre-anesthesia Checklist: Patient identified, Emergency Drugs available, Suction available and Patient being monitored ?Patient Re-evaluated:Patient Re-evaluated prior to induction ?Oxygen Delivery Method: Circle system utilized ?Preoxygenation: Pre-oxygenation with 100% oxygen ?Induction Type: IV induction ?Ventilation: Mask ventilation without difficulty ?Laryngoscope Size: Mac and 4 ?Grade View: Grade II ?Tube type: Oral ?Tube size: 7.0 mm ?Number of attempts: 1 ?Airway Equipment and Method: Stylet and Oral airway ?Placement Confirmation: ETT inserted through vocal cords under direct vision, positive ETCO2 and breath sounds checked- equal and bilateral ?Secured at: 22 cm ?Tube secured with: Tape ?Dental Injury: Teeth and Oropharynx as per pre-operative assessment  ? ? ? ? ?

## 2022-03-18 LAB — SURGICAL PATHOLOGY

## 2022-03-19 ENCOUNTER — Encounter (HOSPITAL_BASED_OUTPATIENT_CLINIC_OR_DEPARTMENT_OTHER): Payer: Self-pay | Admitting: Obstetrics & Gynecology

## 2022-03-31 ENCOUNTER — Ambulatory Visit (INDEPENDENT_AMBULATORY_CARE_PROVIDER_SITE_OTHER): Payer: 59 | Admitting: Obstetrics & Gynecology

## 2022-03-31 ENCOUNTER — Encounter: Payer: Self-pay | Admitting: Obstetrics & Gynecology

## 2022-03-31 VITALS — BP 116/74 | Temp 98.9°F

## 2022-03-31 DIAGNOSIS — Z09 Encounter for follow-up examination after completed treatment for conditions other than malignant neoplasm: Secondary | ICD-10-CM

## 2022-03-31 DIAGNOSIS — R309 Painful micturition, unspecified: Secondary | ICD-10-CM

## 2022-03-31 MED ORDER — FLUCONAZOLE 150 MG PO TABS: 150.0000 mg | ORAL_TABLET | ORAL | 0 refills | Status: AC

## 2022-03-31 NOTE — Progress Notes (Signed)
? ? ?  Jessica Contreras [birth year] [id number] ? ? ?     51 y.o.  [id number]  ? ?RP: Postop XI Robotic TLH/BS on 03/17/2022 ? ?HPI: Healing very well with no abdominopelvic pain.  Incision well closed. No vaginal bleeding.  No fever.  Some discomfort with urination.  BMs normal.   ? ? ?OB History  ?Gravida Para Term Preterm AB Living  ?'3 2     1 2  '$ ?SAB IAB Ectopic Multiple Live Births  ?1          ?  ?# Outcome Date GA Lbr Len/2nd Weight Sex Delivery Anes PTL Lv  ?3 SAB           ?2 Para           ?1 Para           ? ? ?Past medical history,surgical history, problem list, medications, allergies, family history and social history were all reviewed and documented in the EPIC chart. ? ? ?Directed ROS with pertinent positives and negatives documented in the history of present illness/assessment and plan. ? ?Exam: ? ?Vitals:  ? 03/31/22 1522  ?BP: 116/74  ?Temp: 98.9 ?F (37.2 ?C)  ?TempSrc: Oral  ? ?General appearance:  Normal ? ?Abdomen: Incisions well closed.  No erythema.  Soft, NT.  ? ?Gynecologic exam: Vulva normal.  Speculum:  Vaginal vault well closed.  No bleeding.  Thick vaginal discharge c/w yeast. ? ?U/A Negative except for yeasts ? ? ?Assessment/Plan:  51 y.o. [id number]  ? ?1. Status post gynecological surgery, follow-up exam ?Healing very well with no abdominopelvic pain.  Incision well closed. No vaginal bleeding.  No fever.  Some discomfort with urination.  BMs normal.  Normal post op gyn exam, except for yeast vaginitis.  Will treat with Fluconazole.  Usage reviewed, prescription sent to pharmacy.  Can resume work from home.  F/U 4 weeks ? ?2. Pain with urination ?U/A with yeasts.  Treat with Fluconazole.  Pending U. Culture. ?- Urinalysis,Complete w/RFL Culture ? ?Other orders ?- Docusate Calcium (STOOL SOFTENER PO); Take by mouth. ?- fluconazole (DIFLUCAN) 150 MG tablet; Take 1 tablet (150 mg total) by mouth every other day for 3 doses.  ? ?Princess Bruins MD, 3:37 PM 03/31/2022 ? ? ? ?  ?

## 2022-04-02 ENCOUNTER — Encounter: Payer: Self-pay | Admitting: Obstetrics & Gynecology

## 2022-04-02 LAB — URINALYSIS, COMPLETE W/RFL CULTURE
Bacteria, UA: NONE SEEN /HPF
Casts: NONE SEEN /LPF
Crystals: NONE SEEN /HPF
Glucose, UA: NEGATIVE
Hgb urine dipstick: NEGATIVE
Hyaline Cast: NONE SEEN /LPF
Ketones, ur: NEGATIVE
Leukocyte Esterase: NEGATIVE
Nitrites, Initial: NEGATIVE
Protein, ur: NEGATIVE
RBC / HPF: NONE SEEN /HPF (ref 0–2)
Specific Gravity, Urine: 1.024 (ref 1.001–1.035)
WBC, UA: NONE SEEN /HPF (ref 0–5)
pH: 5 (ref 5.0–8.0)

## 2022-04-02 LAB — URINE CULTURE
MICRO NUMBER:: 13371149
Result:: NO GROWTH
SPECIMEN QUALITY:: ADEQUATE

## 2022-04-02 LAB — CULTURE INDICATED

## 2022-04-28 ENCOUNTER — Ambulatory Visit (INDEPENDENT_AMBULATORY_CARE_PROVIDER_SITE_OTHER): Payer: 59 | Admitting: Obstetrics & Gynecology

## 2022-04-28 ENCOUNTER — Encounter: Payer: Self-pay | Admitting: Obstetrics & Gynecology

## 2022-04-28 VITALS — BP 104/64

## 2022-04-28 DIAGNOSIS — Z09 Encounter for follow-up examination after completed treatment for conditions other than malignant neoplasm: Secondary | ICD-10-CM

## 2022-04-28 DIAGNOSIS — R8290 Unspecified abnormal findings in urine: Secondary | ICD-10-CM

## 2022-04-28 LAB — URINALYSIS, COMPLETE W/RFL CULTURE
Bacteria, UA: NONE SEEN /HPF
Bilirubin Urine: NEGATIVE
Casts: NONE SEEN /LPF
Crystals: NONE SEEN /HPF
Glucose, UA: NEGATIVE
Hgb urine dipstick: NEGATIVE
Hyaline Cast: NONE SEEN /LPF
Ketones, ur: NEGATIVE
Leukocyte Esterase: NEGATIVE
Nitrites, Initial: NEGATIVE
Protein, ur: NEGATIVE
RBC / HPF: NONE SEEN /HPF (ref 0–2)
Specific Gravity, Urine: 1.02 (ref 1.001–1.035)
WBC, UA: NONE SEEN /HPF (ref 0–5)
Yeast: NONE SEEN /HPF
pH: 6.5 (ref 5.0–8.0)

## 2022-04-28 LAB — NO CULTURE INDICATED

## 2022-04-28 NOTE — Progress Notes (Signed)
    Jessica Contreras 1971/02/02 [id number]        51 y.o.  [id number]   RP: Postop XI Robotic TLH/BS on 03/17/22  HPI: Excellent postop with no abdominopelvic pain.  No vaginal bleeding.  No vaginal discharge.  Mildly cloudy urine.  No fever.   OB History  Gravida Para Term Preterm AB Living  '3 2     1 2  '$ SAB IAB Ectopic Multiple Live Births  1            # Outcome Date GA Lbr Len/2nd Weight Sex Delivery Anes PTL Lv  3 SAB           2 Para           1 Para             Past medical history,surgical history, problem list, medications, allergies, family history and social history were all reviewed and documented in the EPIC chart.   Directed ROS with pertinent positives and negatives documented in the history of present illness/assessment and plan.  Exam:  Vitals:   04/28/22 1428  BP: 104/64   General appearance:  Normal  Abdomen: Incisions well closed.  No erythema.  Soft, normal.  Gynecologic exam: Vulva normal.  Bimanual exam:  Vaginal vault well closed.  NT.  No blood.  No discharge.  No pelvic mass.  U/A completely Negative   Assessment/Plan:  51 y.o. [id number]   1. Status post gynecological surgery, follow-up exam Excellent postop with no abdominopelvic pain.  No vaginal bleeding.  No vaginal discharge.  Mildly cloudy urine.  No fever.  Vaginal vault well healed.  Can resume IC in 2 weeks.  2. Cloudy urine U/A Neg.  Reassured. - Urinalysis,Complete w/RFL Culture  Other orders - norethindrone (MICRONOR) 0.35 MG tablet; Take 1 tablet by mouth daily. - cetirizine (ZYRTEC) 10 MG tablet; Take 10 mg by mouth daily.   Princess Bruins MD, 2:45 PM 04/28/2022

## 2022-05-21 ENCOUNTER — Other Ambulatory Visit: Payer: Self-pay | Admitting: Obstetrics & Gynecology

## 2022-05-21 NOTE — Telephone Encounter (Signed)
Annual exam was 11/2021 Rx sent with 90 day supply with 2 refills.

## 2023-05-20 ENCOUNTER — Ambulatory Visit (INDEPENDENT_AMBULATORY_CARE_PROVIDER_SITE_OTHER): Payer: 59 | Admitting: Nurse Practitioner

## 2023-05-20 ENCOUNTER — Encounter: Payer: Self-pay | Admitting: Nurse Practitioner

## 2023-05-20 VITALS — BP 120/74 | HR 69 | Temp 98.5°F | Ht 62.0 in | Wt 267.0 lb

## 2023-05-20 DIAGNOSIS — G4719 Other hypersomnia: Secondary | ICD-10-CM | POA: Diagnosis not present

## 2023-05-20 DIAGNOSIS — Z6841 Body Mass Index (BMI) 40.0 and over, adult: Secondary | ICD-10-CM

## 2023-05-20 DIAGNOSIS — E66813 Obesity, class 3: Secondary | ICD-10-CM | POA: Insufficient documentation

## 2023-05-20 DIAGNOSIS — R0683 Snoring: Secondary | ICD-10-CM | POA: Diagnosis not present

## 2023-05-20 NOTE — Assessment & Plan Note (Signed)
See above

## 2023-05-20 NOTE — Patient Instructions (Signed)
Given your symptoms, I am concerned that you may have sleep disordered breathing with sleep apnea. You will need a sleep study for further evaluation. Someone will contact you to schedule this.   We discussed how untreated sleep apnea puts an individual at risk for cardiac arrhthymias, pulm HTN, DM, stroke and increases their risk for daytime accidents. We also briefly reviewed treatment options including weight loss, side sleeping position, oral appliance, CPAP therapy or referral to ENT for possible surgical options  Use caution when driving and pull over if you become sleepy.  Follow up in 6 weeks with Katie Cove Haydon,NP to go over sleep study results, or sooner, if needed   

## 2023-05-20 NOTE — Assessment & Plan Note (Signed)
She has snoring, excessive daytime sleepiness, nocturnal apneic events, headaches, restless sleep. BMI 48. Epworth 18. Given this,  I am concerned she could have sleep disordered breathing with obstructive sleep apnea. She will need sleep study for further evaluation.    - discussed how weight can impact sleep and risk for sleep disordered breathing - discussed options to assist with weight loss: combination of diet modification, cardiovascular and strength training exercises   - had an extensive discussion regarding the adverse health consequences related to untreated sleep disordered breathing - specifically discussed the risks for hypertension, coronary artery disease, cardiac dysrhythmias, cerebrovascular disease, and diabetes - lifestyle modification discussed   - discussed how sleep disruption can increase risk of accidents, particularly when driving - safe driving practices were discussed  Patient Instructions  Given your symptoms, I am concerned that you may have sleep disordered breathing with sleep apnea. You will need a sleep study for further evaluation. Someone will contact you to schedule this.   We discussed how untreated sleep apnea puts an individual at risk for cardiac arrhthymias, pulm HTN, DM, stroke and increases their risk for daytime accidents. We also briefly reviewed treatment options including weight loss, side sleeping position, oral appliance, CPAP therapy or referral to ENT for possible surgical options  Use caution when driving and pull over if you become sleepy.  Follow up in 6 weeks with Jessica Amilliana Hayworth,NP to go over sleep study results, or sooner, if needed

## 2023-05-20 NOTE — Progress Notes (Signed)
@Patient  ID: Jessica Contreras, female    DOB: Nov 04, 1971, 52 y.o.   MRN: [id number]  Chief Complaint  Patient presents with   Consult    Never had a sleep study before. Pt states she has daytime sleepiness, loud snoring. Pt states she has had times she woke up choking.     Referring provider: Leonard Downing  HPI: 52 year old female, former smoker referred for sleep consult. Past medical history significant for allergic rhinitis, GERD, obesity.   TEST/EVENTS:   05/20/2023: Today - sleep consult. Patient presents today for sleep consult, referred by Romie Jumper, PA. She feels tired all day. She finds herself dozing in conversations sometimes. She has loud snoring. She does fall asleep quickly but doesn't feel like she gets into a deep sleep. Tosses and turns often. Wakes up feeling poorly rested. Woken up choking before. She has never been told she stops breathing when she sleeps. She has been having some trouble with headaches recently. No drowsy driving, sleep parasomnias/paralysis.  She goes to bed at various times. Falls asleep quickly. Wakes multiple times. Usually gets up around 6 am. No sleep aids. 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 No previous sleep studies. No oxygen use. No weight gain. She does not have any cardiac history, history of stroke, or diabetes. She is a never smoker. She drinks alcohol socially. She drinks 2 redbulls a day. She lives with her boyfriend. 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   Epworth 18  Allergies  Allergen Reactions   Morphine And Codeine Itching   Latex Rash    When exposed to latex for a long time per pt     There is no immunization history on file for this patient.  Past Medical History:  Diagnosis Date   Anemia 12/02/2021   Hgb 9.0   Arthritis    lower back   Breast pain, left 02/2022   not associated with sob or exertion, pt states she feels like the pain is in her breast   Chest wall pain  12/12/2018   EKG, chest x-ray and troponin negative. See MD note in Epic from 12/12/18 ED visit.   GERD (gastroesophageal reflux disease)    Gout June 07, 2017   right foot, pt had injury to her foot, was diagnosed with gout, but pt is unsure that she really had gout   Headache    hx of migraines when pt was in her Jun 08, 2023, still gets migraines occasionally   Heart murmur    as a child   HSV (herpes simplex virus) anogenital infection 08/2019   Upper buttocks   Hypertension    was on meds, but no longer needs per pt as of 03/02/22   Menorrhagia 12/12/2021   Panic attack June 08, 2007   after sister died   Pre-diabetes    Scratch of cornea    left eye, no problems, was told by eye doctor   Uterine fibroid 11/2021   Viral illness 07/2019   Pt stated that she had covid symptoms, fever, body aches, cought etc. She stated that she was sick for over a month and visited the doctor mutilple time. She tested negative for COVID mutiple times.    Tobacco History: Social History   Tobacco Use  Smoking Status Former   Types: Cigars  Smokeless Tobacco Never  Tobacco Comments   Only smoked cigars socially. Patient stated that she had 1 cigar every 2 to 3 months. Hasn't smoked in 6  months as of 03/02/22.   Counseling given: Not Answered Tobacco comments: Only smoked cigars socially. Patient stated that she had 1 cigar every 2 to 3 months. Hasn't smoked in 6 months as of 03/02/22.   Outpatient Medications Prior to Visit  Medication Sig Dispense Refill   CALCIUM PO Take by mouth.     cetirizine (ZYRTEC) 10 MG tablet Take 10 mg by mouth daily.     Docusate Calcium (STOOL SOFTENER PO) Take by mouth.     FERREX 150 150 MG capsule Take 150 mg by mouth daily.     ibuprofen (ADVIL) 800 MG tablet Take 1 tablet (800 mg total) by mouth every 8 (eight) hours as needed. 30 tablet 1   norethindrone (MICRONOR) 0.35 MG tablet Take 1 tablet by mouth daily.     valACYclovir (VALTREX) 500 MG tablet TAKE 1 TABLET BY MOUTH DAILY 90  tablet 2   dexlansoprazole (DEXILANT) 60 MG capsule Take 1 tablet by mouth daily. (Patient not taking: Reported on 04/28/2022)     No facility-administered medications prior to visit.     Review of Systems:   Constitutional: No weight loss or gain, night sweats, fevers, chills, or lassitude. +fatigue  HEENT: No difficulty swallowing, tooth/dental problems, or sore throat. No sneezing, itching, ear ache, nasal congestion, or post nasal drip. +headaches  CV:  No chest pain, orthopnea, PND, swelling in lower extremities, anasarca, dizziness, palpitations, syncope Resp: +snoring; shortness of breath with exertion (Baseline). No excess mucus or change in color of mucus. No productive or non-productive. No hemoptysis. No wheezing.  No chest wall deformity GI:  +controlled heartburn, indigestion. No abdominal pain, nausea, vomiting, diarrhea, change in bowel habits, loss of appetite, bloody stools.  GU: No dysuria, change in color of urine, urgency or frequency.   Skin: No rash, lesions, ulcerations MSK:  No joint pain or swelling.   Neuro: No dizziness or lightheadedness.  Psych: No depression or anxiety. Mood stable. +sleep disturbance     Physical Exam:  BP 120/74   Pulse 69   Temp 98.5 F (36.9 C)   Ht 5\' 2"  (1.575 m)   Wt 267 lb (121.1 kg)   LMP 03/05/2022 (Approximate)   SpO2 97%   BMI 48.83 kg/m   GEN: Pleasant, interactive, well-appearing; morbidly obese; in no acute distress. HEENT:  Normocephalic and atraumatic. PERRLA. Sclera white. Nasal turbinates pink, moist and patent bilaterally. No rhinorrhea present. Oropharynx pink and moist, without exudate or edema. No lesions, ulcerations, or postnasal drip. Mallampati IV NECK:  Supple w/ fair ROM. No JVD present. Normal carotid impulses w/o bruits. Thyroid symmetrical with no goiter or nodules palpated. No lymphadenopathy.   CV: RRR, no m/r/g, no peripheral edema. Pulses intact, +2 bilaterally. No cyanosis, pallor or  clubbing. PULMONARY:  Unlabored, regular breathing. Clear bilaterally A&P w/o wheezes/rales/rhonchi. No accessory muscle use.  GI: BS present and normoactive. Soft, non-tender to palpation. No organomegaly or masses detected.  MSK: No erythema, warmth or tenderness. Cap refil <2 sec all extrem. No deformities or joint swelling noted.  Neuro: A/Ox3. No focal deficits noted.   Skin: Warm, no lesions or rashe Psych: Normal affect and behavior. Judgement and thought content appropriate.     Lab Results:  CBC    Component Value Date/Time   WBC 14.0 (H) 03/17/2022 1400   RBC 3.88 03/17/2022 1400   HGB 10.8 (L) 03/17/2022 1400   HCT 33.3 (L) 03/17/2022 1400   PLT 316 03/17/2022 1400   MCV 85.8 03/17/2022 1400  MCH 27.8 03/17/2022 1400   MCHC 32.4 03/17/2022 1400   RDW 20.4 (H) 03/17/2022 1400   LYMPHSABS 2.2 12/12/2018 1357   MONOABS 0.6 12/12/2018 1357   EOSABS 0.1 12/12/2018 1357   BASOSABS 0.0 12/12/2018 1357    BMET    Component Value Date/Time   NA 138 12/12/2018 1357   K 4.2 12/12/2018 1357   CL 106 12/12/2018 1357   CO2 22 12/12/2018 1357   GLUCOSE 94 12/12/2018 1357   BUN 10 12/12/2018 1357   CREATININE 0.86 12/12/2018 1357   CREATININE 0.91 11/09/2012 1210   CALCIUM 8.9 12/12/2018 1357   GFRNONAA >60 12/12/2018 1357   GFRAA >60 12/12/2018 1357    BNP No results found for: "BNP"   Imaging:  No results found.        No data to display          No results found for: "NITRICOXIDE"      Assessment & Plan:   Excessive daytime sleepiness She has snoring, excessive daytime sleepiness, nocturnal apneic events, headaches, restless sleep. BMI 48. Epworth 18. Given this,  I am concerned she could have sleep disordered breathing with obstructive sleep apnea. She will need sleep study for further evaluation.    - discussed how weight can impact sleep and risk for sleep disordered breathing - discussed options to assist with weight loss: combination of  diet modification, cardiovascular and strength training exercises   - had an extensive discussion regarding the adverse health consequences related to untreated sleep disordered breathing - specifically discussed the risks for hypertension, coronary artery disease, cardiac dysrhythmias, cerebrovascular disease, and diabetes - lifestyle modification discussed   - discussed how sleep disruption can increase risk of accidents, particularly when driving - safe driving practices were discussed  Patient Instructions  Given your symptoms, I am concerned that you may have sleep disordered breathing with sleep apnea. You will need a sleep study for further evaluation. Someone will contact you to schedule this.   We discussed how untreated sleep apnea puts an individual at risk for cardiac arrhthymias, pulm HTN, DM, stroke and increases their risk for daytime accidents. We also briefly reviewed treatment options including weight loss, side sleeping position, oral appliance, CPAP therapy or referral to ENT for possible surgical options  Use caution when driving and pull over if you become sleepy.  Follow up in 6 weeks with Katie Samuel Rittenhouse,NP to go over sleep study results, or sooner, if needed     Loud snoring See above   Class 3 severe obesity due to excess calories with body mass index (BMI) of 45.0 to 49.9 in adult (HCC) BMI 48.8. Healthy weight loss encouraged.    I spent 35 minutes of dedicated to the care of this patient on the date of this encounter to include pre-visit review of records, face-to-face time with the patient discussing conditions above, post visit ordering of testing, clinical documentation with the electronic health record, making appropriate referrals as documented, and communicating necessary findings to members of the patients care team.  Noemi Chapel, NP 05/20/2023  Pt aware and understands NP's role.

## 2023-05-20 NOTE — Assessment & Plan Note (Signed)
BMI 48.8. Healthy weight loss encouraged.

## 2023-07-05 ENCOUNTER — Ambulatory Visit: Payer: 59 | Admitting: Nurse Practitioner

## 2023-07-05 NOTE — Progress Notes (Unsigned)
@Patient  ID: Jessica Contreras, female    DOB: [birth year], 52 y.o.   MRN: [id number]  No chief complaint on file.   Referring provider: Malka So., MD  HPI: 52 year old female, former smoker referred for sleep consult. Past medical history significant for allergic rhinitis, GERD, obesity.   TEST/EVENTS:   05/20/2023: Today - sleep consult. Patient presents today for sleep consult, referred by Romie Jumper, PA. She feels tired all day. She finds herself dozing in conversations sometimes. She has loud snoring. She does fall asleep quickly but doesn't feel like she gets into a deep sleep. Tosses and turns often. Wakes up feeling poorly rested. Woken up choking before. She has never been told she stops breathing when she sleeps. She has been having some trouble with headaches recently. No drowsy driving, sleep parasomnias/paralysis.  She goes to bed at various times. Falls asleep quickly. Wakes multiple times. Usually gets up around 6 am. No sleep aids. 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 No previous sleep studies. No oxygen use. No weight gain. She does not have any cardiac history, history of stroke, or diabetes. She is a never smoker. She drinks alcohol socially. She drinks 2 redbulls a day. She lives with her boyfriend. 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   Epworth 18  Allergies  Allergen Reactions   Morphine And Codeine Itching   Latex Rash    When exposed to latex for a long time per pt     There is no immunization history on file for this patient.  Past Medical History:  Diagnosis Date   Anemia 12/02/2021   Hgb 9.0   Arthritis    lower back   Breast pain, left 02/2022   not associated with sob or exertion, pt states she feels like the pain is in her breast   Chest wall pain 12/12/2018   EKG, chest x-ray and troponin negative. See MD note in Epic from 12/12/18 ED visit.   GERD (gastroesophageal reflux disease)    Gout 07-18-2017   right foot, pt  had injury to her foot, was diagnosed with gout, but pt is unsure that she really had gout   Headache    hx of migraines when pt was in her 07-19-23, still gets migraines occasionally   Heart murmur    as a child   HSV (herpes simplex virus) anogenital infection 08/2019   Upper buttocks   Hypertension    was on meds, but no longer needs per pt as of 03/02/22   Menorrhagia 12/12/2021   Panic attack July 19, 2007   after sister died   Pre-diabetes    Scratch of cornea    left eye, no problems, was told by eye doctor   Uterine fibroid 11/2021   Viral illness 07/2019   Pt stated that she had covid symptoms, fever, body aches, cought etc. She stated that she was sick for over a month and visited the doctor mutilple time. She tested negative for COVID mutiple times.    Tobacco History: Social History   Tobacco Use  Smoking Status Former   Types: Cigars  Smokeless Tobacco Never  Tobacco Comments   Only smoked cigars socially. Patient stated that she had 1 cigar every 2 to 3 months. Hasn't smoked in 6 months as of 03/02/22.   Counseling given: Not Answered Tobacco comments: Only smoked cigars socially. Patient stated that she had 1 cigar every 2 to 3 months. Hasn't smoked in 6 months  as of 03/02/22.   Outpatient Medications Prior to Visit  Medication Sig Dispense Refill   CALCIUM PO Take by mouth.     cetirizine (ZYRTEC) 10 MG tablet Take 10 mg by mouth daily.     dexlansoprazole (DEXILANT) 60 MG capsule Take 1 tablet by mouth daily. (Patient not taking: Reported on 04/28/2022)     Docusate Calcium (STOOL SOFTENER PO) Take by mouth.     FERREX 150 150 MG capsule Take 150 mg by mouth daily.     ibuprofen (ADVIL) 800 MG tablet Take 1 tablet (800 mg total) by mouth every 8 (eight) hours as needed. 30 tablet 1   norethindrone (MICRONOR) 0.35 MG tablet Take 1 tablet by mouth daily.     valACYclovir (VALTREX) 500 MG tablet TAKE 1 TABLET BY MOUTH DAILY 90 tablet 2   No facility-administered medications  prior to visit.     Review of Systems:   Constitutional: No weight loss or gain, night sweats, fevers, chills, or lassitude. +fatigue  HEENT: No difficulty swallowing, tooth/dental problems, or sore throat. No sneezing, itching, ear ache, nasal congestion, or post nasal drip. +headaches  CV:  No chest pain, orthopnea, PND, swelling in lower extremities, anasarca, dizziness, palpitations, syncope Resp: +snoring; shortness of breath with exertion (Baseline). No excess mucus or change in color of mucus. No productive or non-productive. No hemoptysis. No wheezing.  No chest wall deformity GI:  +controlled heartburn, indigestion. No abdominal pain, nausea, vomiting, diarrhea, change in bowel habits, loss of appetite, bloody stools.  GU: No dysuria, change in color of urine, urgency or frequency.   Skin: No rash, lesions, ulcerations MSK:  No joint pain or swelling.   Neuro: No dizziness or lightheadedness.  Psych: No depression or anxiety. Mood stable. +sleep disturbance     Physical Exam:  LMP 03/05/2022 (Approximate)   GEN: Pleasant, interactive, well-appearing; morbidly obese; in no acute distress. HEENT:  Normocephalic and atraumatic. PERRLA. Sclera white. Nasal turbinates pink, moist and patent bilaterally. No rhinorrhea present. Oropharynx pink and moist, without exudate or edema. No lesions, ulcerations, or postnasal drip. Mallampati IV NECK:  Supple w/ fair ROM. No JVD present. Normal carotid impulses w/o bruits. Thyroid symmetrical with no goiter or nodules palpated. No lymphadenopathy.   CV: RRR, no m/r/g, no peripheral edema. Pulses intact, +2 bilaterally. No cyanosis, pallor or clubbing. PULMONARY:  Unlabored, regular breathing. Clear bilaterally A&P w/o wheezes/rales/rhonchi. No accessory muscle use.  GI: BS present and normoactive. Soft, non-tender to palpation. No organomegaly or masses detected.  MSK: No erythema, warmth or tenderness. Cap refil <2 sec all extrem. No  deformities or joint swelling noted.  Neuro: A/Ox3. No focal deficits noted.   Skin: Warm, no lesions or rashe Psych: Normal affect and behavior. Judgement and thought content appropriate.     Lab Results:  CBC    Component Value Date/Time   WBC 14.0 (H) 03/17/2022 1400   RBC 3.88 03/17/2022 1400   HGB 10.8 (L) 03/17/2022 1400   HCT 33.3 (L) 03/17/2022 1400   PLT 316 03/17/2022 1400   MCV 85.8 03/17/2022 1400   MCH 27.8 03/17/2022 1400   MCHC 32.4 03/17/2022 1400   RDW 20.4 (H) 03/17/2022 1400   LYMPHSABS 2.2 12/12/2018 1357   MONOABS 0.6 12/12/2018 1357   EOSABS 0.1 12/12/2018 1357   BASOSABS 0.0 12/12/2018 1357    BMET    Component Value Date/Time   NA 138 12/12/2018 1357   K 4.2 12/12/2018 1357   CL 106 12/12/2018 1357  CO2 22 12/12/2018 1357   GLUCOSE 94 12/12/2018 1357   BUN 10 12/12/2018 1357   CREATININE 0.86 12/12/2018 1357   CREATININE 0.91 11/09/2012 1210   CALCIUM 8.9 12/12/2018 1357   GFRNONAA >60 12/12/2018 1357   GFRAA >60 12/12/2018 1357    BNP No results found for: "BNP"   Imaging:  No results found.  Administration History     None           No data to display          No results found for: "NITRICOXIDE"      Assessment & Plan:   No problem-specific Assessment & Plan notes found for this encounter.    I spent 35 minutes of dedicated to the care of this patient on the date of this encounter to include pre-visit review of records, face-to-face time with the patient discussing conditions above, post visit ordering of testing, clinical documentation with the electronic health record, making appropriate referrals as documented, and communicating necessary findings to members of the patients care team.  Noemi Chapel, NP 07/05/2023  Pt aware and understands NP's role.

## 2023-07-26 ENCOUNTER — Other Ambulatory Visit: Payer: Self-pay | Admitting: Obstetrics & Gynecology

## 2023-07-27 NOTE — Telephone Encounter (Signed)
Med refill request:  Valtrex Last AEX: 12/02/21 Next AEX: not scheduled Last MMG (if hormonal med) 08/24/18 Refill authorized: Please Advise, #[phone], 0 RF, asked front to schedule pt for AEX

## 2023-07-28 ENCOUNTER — Telehealth: Payer: Self-pay

## 2023-07-28 NOTE — Telephone Encounter (Signed)
-----   Message from Truchas S sent at 07/28/2023  8:51 AM EDT ----- Done with  Dr Kennith Center ----- Message ----- From: Melrose Nakayama, CMA Sent: 07/27/2023   3:07 PM EDT To: [facility] Appointments  Please schedule pt for aex for further refills. Pt was seeing ML

## 2023-07-28 NOTE — Telephone Encounter (Signed)
Pt scheduled for AEX 1010/24 with GH

## 2023-08-09 ENCOUNTER — Ambulatory Visit: Payer: 59 | Admitting: Nurse Practitioner

## 2023-08-31 DIAGNOSIS — G4733 Obstructive sleep apnea (adult) (pediatric): Secondary | ICD-10-CM | POA: Diagnosis not present

## 2023-08-31 DIAGNOSIS — G4719 Other hypersomnia: Secondary | ICD-10-CM

## 2023-09-02 ENCOUNTER — Ambulatory Visit: Payer: 59 | Admitting: Obstetrics and Gynecology

## 2023-09-16 ENCOUNTER — Encounter: Payer: Self-pay | Admitting: Obstetrics and Gynecology

## 2023-09-16 ENCOUNTER — Ambulatory Visit: Payer: 59 | Admitting: Obstetrics and Gynecology

## 2023-09-16 VITALS — BP 116/72 | Ht 63.25 in | Wt 272.0 lb

## 2023-09-16 DIAGNOSIS — Z01419 Encounter for gynecological examination (general) (routine) without abnormal findings: Secondary | ICD-10-CM | POA: Insufficient documentation

## 2023-09-16 DIAGNOSIS — E66813 Obesity, class 3: Secondary | ICD-10-CM | POA: Diagnosis not present

## 2023-09-16 NOTE — Assessment & Plan Note (Signed)
Encouraged healthy diet and exercise

## 2023-09-16 NOTE — Progress Notes (Signed)
52 y.o. [id number] female s/p RATLH, BS here for annual exam.  Reports back, hip and shoulder pain, being evaluated by neurology. Completed PT.  Abnormal bleeding: none Pelvic discharge or pain: none Breast mass, nipple discharge or skin changes : none Last PAP: No results found for: "DIAGPAP", "HPVHIGH", "ADEQPAP" Last mammogram: 10-17-21 [facility], planning to complete today Last colonoscopy: 05/2021 (care everywhere) Exercising: no due to back, hip and shoulder pain, being evaluated by neurology  GYN HISTORY: 10/17/22 benign RATLH, BS (AUB-F, dysmenorrhea)  OB History  Gravida Para Term Preterm AB Living  3 2     1 2   SAB IAB Ectopic Multiple Live Births  1            # Outcome Date GA Lbr Len/2nd Weight Sex Type Anes PTL Lv  3 SAB           2 Para           1 Para             Past Medical History:  Diagnosis Date   Anemia 12/02/2021   Hgb 9.0   Arthritis    lower back   Breast pain, left 02/2022   not associated with sob or exertion, pt states she feels like the pain is in her breast   Chest wall pain 12/12/2018   EKG, chest x-ray and troponin negative. See MD note in Epic from 12/12/18 ED visit.   GERD (gastroesophageal reflux disease)    Gout 10/17/2017   right foot, pt had injury to her foot, was diagnosed with gout, but pt is unsure that she really had gout   Headache    hx of migraines when pt was in her 10-18-23, still gets migraines occasionally   Heart murmur    as a child   HSV (herpes simplex virus) anogenital infection 10/18/2019   Upper buttocks   Hypertension    was on meds, but no longer needs per pt as of 03/02/22   Menorrhagia 12/12/2021   Panic attack Oct 18, 2007   after sister died   Pre-diabetes    Scratch of cornea    left eye, no problems, was told by eye doctor   Uterine fibroid 11/2021   Viral illness 07/2019   Pt stated that she had covid symptoms, fever, body aches, cought etc. She stated that she was sick for over a month and visited the doctor mutilple time.  She tested negative for COVID mutiple times.    Past Surgical History:  Procedure Laterality Date   ROBOTIC ASSISTED TOTAL HYSTERECTOMY Bilateral 03/17/2022   Procedure: XI ROBOTIC ASSISTED TOTAL Laparoscopic HYSTERECTOMY with bilateral salpingectomy;  Surgeon: Genia Del, MD;  Location: [facility];  Service: Gynecology;  Laterality: Bilateral;   TUBAL LIGATION  1997   POST PARTUM   WISDOM TOOTH EXTRACTION     when patient was in late 10-18-2023 or early 10/18/23    Current Outpatient Medications on File Prior to Visit  Medication Sig Dispense Refill   CALCIUM PO Take by mouth.     cetirizine (ZYRTEC) 10 MG tablet Take 10 mg by mouth daily.     Cholecalciferol (VITAMIN D-3 PO) Take by mouth.     Docusate Calcium (STOOL SOFTENER PO) Take by mouth.     Famotidine (ACID REDUCER PO) Take by mouth.     FERREX 150 150 MG capsule Take 150 mg by mouth daily.     ibuprofen (ADVIL) 800 MG tablet Take 1 tablet (800  mg total) by mouth every 8 (eight) hours as needed. 30 tablet 1   valACYclovir (VALTREX) 500 MG tablet TAKE 1 TABLET BY MOUTH DAILY 90 tablet 0   No current facility-administered medications on file prior to visit.    Social History   Socioeconomic History   Marital status: Single    Spouse name: Not on file   Number of children: Not on file   Years of education: Not on file   Highest education level: Not on file  Occupational History   Not on file  Tobacco Use   Smoking status: Former    Types: Cigars    Passive exposure: Current   Smokeless tobacco: Never   Tobacco comments:    Only smoked cigars socially. Patient stated that she had 1 cigar every 2 to 3 months. Hasn't smoked in 6 months as of 03/02/22.  Vaping Use   Vaping status: Never Used  Substance and Sexual Activity   Alcohol use: Yes    Comment: socially, once or twice a month   Drug use: Not Currently   Sexual activity: Yes    Partners: Male    Birth control/protection: Surgical    Comment:  BTL, hysterectomy-1st intercourse 67 yo-5 partners  Other Topics Concern   Not on file  Social History Narrative   Not on file   Social Determinants of Health   Financial Resource Strain: Medium Risk (06/14/2023)   Received from [facility]   Overall Financial Resource Strain (CARDIA)    Difficulty of Paying Living Expenses: Somewhat hard  Food Insecurity: No Food Insecurity (06/14/2023)   Received from [facility]   Hunger Vital Sign    Worried About Running Out of Food in the Last Year: Never true    Ran Out of Food in the Last Year: Never true  Transportation Needs: No Transportation Needs (06/14/2023)   Received from [facility] - Transportation    Lack of Transportation (Medical): No    Lack of Transportation (Non-Medical): No  Physical Activity: Insufficiently Active (06/14/2023)   [facility] from [facility]   Exercise Vital Sign    Days of Exercise per Week: 2 days    Minutes of Exercise per Session: 60 min  Stress: Stress Concern Present (06/14/2023)   Received from [facility] - Occupational Stress Questionnaire    Feeling of Stress : Rather much  Social Connections: Socially Integrated (06/14/2023)   Received from [facility]   Social Network    How would you rate your social network (family, work, friends)?: Good participation with social networks  Intimate Partner Violence: Not At Risk (06/14/2023)   Received from [facility]   HITS    Over the last 12 months how often did your partner physically hurt you?: 1    Over the last 12 months how often did your partner insult you or talk down to you?: 1    Over the last 12 months how often did your partner threaten you with physical harm?: 1    Over the last 12 months how often did your partner scream or curse at you?: 1    Family History  Problem Relation Age of Onset   Sarcoidosis Mother    Cancer Maternal Aunt        LUNG   Diabetes Maternal  Grandmother    Hypertension Maternal Grandmother     Allergies  Allergen Reactions   Morphine And Codeine Itching   Latex  Rash    When exposed to latex for a long time per pt      PE Today's Vitals   09/16/23 1512  BP: 116/72  Weight: 272 lb (123.4 kg)  Height: 5' 3.25" (1.607 m)   Body mass index is 47.8 kg/m.  Physical Exam Vitals reviewed. Exam conducted with a chaperone present.  Constitutional:      General: She is not in acute distress.    Appearance: Normal appearance.  HENT:     Head: Normocephalic and atraumatic.     Nose: Nose normal.  Eyes:     Extraocular Movements: Extraocular movements intact.     Conjunctiva/sclera: Conjunctivae normal.  Neck:     Thyroid: No thyroid mass, thyromegaly or thyroid tenderness.  Pulmonary:     Effort: Pulmonary effort is normal.  Chest:     Chest wall: No mass or tenderness.  Breasts:    Right: Normal. No swelling, mass, nipple discharge or tenderness.     Left: Normal. No swelling, mass, nipple discharge or tenderness.  Abdominal:     General: There is no distension.     Palpations: Abdomen is soft.     Tenderness: There is no abdominal tenderness.  Genitourinary:    General: Normal vulva.     Exam position: Lithotomy position.     Urethra: No prolapse.     Vagina: Normal. No vaginal discharge or bleeding.     Cervix: No lesion.     Adnexa: Right adnexa normal and left adnexa normal.     Comments: Cervix and uterus absent Musculoskeletal:        General: Normal range of motion.     Cervical back: Normal range of motion.  Lymphadenopathy:     Upper Body:     Right upper body: No axillary adenopathy.     Left upper body: No axillary adenopathy.     Lower Body: No right inguinal adenopathy. No left inguinal adenopathy.  Skin:    General: Skin is warm and dry.  Neurological:     General: No focal deficit present.     Mental Status: She is alert.  Psychiatric:        Mood and Affect: Mood normal.         Behavior: Behavior normal.       Assessment and Plan:        Well woman exam with routine gynecological exam Assessment & Plan: Cervical cancer screening: N/A due to benign RALTH Encouraged annual mammogram screening Colonoscopy UTD Labs and immunizations with her primary Encouraged safe sexual practices as indicated Encouraged healthy lifestyle practices with diet and exercise For patients under 50-70yo, I recommend 1200mg  calcium daily and 600IU of vitamin D daily.    Class 3 severe obesity due to excess calories with body mass index (BMI) of 45.0 to 49.9 in adult, unspecified whether serious comorbidity present [facility]) Assessment & Plan: Encouraged healthy diet and exercise.   Rosalyn Gess, MD

## 2023-09-16 NOTE — Patient Instructions (Signed)
Meditation has been shown to decrease frequency and severity of hot flashes. Consider using insighttimer, a mediation app, that has a guided meditation for hot flashes.  For patients under 50-51yo, I recommend 1200mg  calcium daily and 600IU of vitamin D daily. For patients over 51yo, I recommend 1200mg  calcium daily and 800IU of vitamin D daily.  Health Maintenance, Female Adopting a healthy lifestyle and getting preventive care are important in promoting health and wellness. Ask your health care provider about: The right schedule for you to have regular tests and exams. Things you can do on your own to prevent diseases and keep yourself healthy. What should I know about diet, weight, and exercise? Eat a healthy diet  Eat a diet that includes plenty of vegetables, fruits, low-fat dairy products, and lean protein. Do not eat a lot of foods that are high in solid fats, added sugars, or sodium. Maintain a healthy weight Body mass index (BMI) is used to identify weight problems. It estimates body fat based on height and weight. Your health care provider can help determine your BMI and help you achieve or maintain a healthy weight. Get regular exercise Get regular exercise. This is one of the most important things you can do for your health. Most adults should: Exercise for at least 150 minutes each week. The exercise should increase your heart rate and make you sweat (moderate-intensity exercise). Do strengthening exercises at least twice a week. This is in addition to the moderate-intensity exercise. Spend less time sitting. Even light physical activity can be beneficial. Watch cholesterol and blood lipids Have your blood tested for lipids and cholesterol at 51 years of age, then have this test every 5 years. Have your cholesterol levels checked more often if: Your lipid or cholesterol levels are high. You are older than 51 years of age. You are at high risk for heart disease. What should I  know about cancer screening? Depending on your health history and family history, you may need to have cancer screening at various ages. This may include screening for: Breast cancer. Cervical cancer. Colorectal cancer. Skin cancer. Lung cancer. What should I know about heart disease, diabetes, and high blood pressure? Blood pressure and heart disease High blood pressure causes heart disease and increases the risk of stroke. This is more likely to develop in people who have high blood pressure readings or are overweight. Have your blood pressure checked: Every 3-5 years if you are 54-45 years of age. Every year if you are 89 years old or older. Diabetes Have regular diabetes screenings. This checks your fasting blood sugar level. Have the screening done: Once every three years after age 65 if you are at a normal weight and have a low risk for diabetes. More often and at a younger age if you are overweight or have a high risk for diabetes. What should I know about preventing infection? Hepatitis B If you have a higher risk for hepatitis B, you should be screened for this virus. Talk with your health care provider to find out if you are at risk for hepatitis B infection. Hepatitis C Testing is recommended for: Everyone born from [birth year] through 1964. Anyone with known risk factors for hepatitis C. Sexually transmitted infections (STIs) Get screened for STIs, including gonorrhea and chlamydia, if: You are sexually active and are younger than 51 years of age. You are older than 51 years of age and your health care provider tells you that you are at risk for this type of  infection. Your sexual activity has changed since you were last screened, and you are at increased risk for chlamydia or gonorrhea. Ask your health care provider if you are at risk. Ask your health care provider about whether you are at high risk for HIV. Your health care provider may recommend a prescription medicine to help  prevent HIV infection. If you choose to take medicine to prevent HIV, you should first get tested for HIV. You should then be tested every 3 months for as long as you are taking the medicine. Osteoporosis and menopause Osteoporosis is a disease in which the bones lose minerals and strength with aging. This can result in bone fractures. If you are 27 years old or older, or if you are at risk for osteoporosis and fractures, ask your health care provider if you should: Be screened for bone loss. Take a calcium or vitamin D supplement to lower your risk of fractures. Be given hormone replacement therapy (HRT) to treat symptoms of menopause. Follow these instructions at home: Alcohol use Do not drink alcohol if: Your health care provider tells you not to drink. You are pregnant, may be pregnant, or are planning to become pregnant. If you drink alcohol: Limit how much you have to: 0-1 drink a day. Know how much alcohol is in your drink. In the [place] one drink equals one 12 oz bottle of beer (355 mL), one 5 oz glass of wine (148 mL), or one 1 oz glass of hard liquor (44 mL). Lifestyle Do not use any products that contain nicotine or tobacco. These products include cigarettes, chewing tobacco, and vaping devices, such as e-cigarettes. If you need help quitting, ask your health care provider. Do not use street drugs. Do not share needles. Ask your health care provider for help if you need support or information about quitting drugs. General instructions Schedule regular health, dental, and eye exams. Stay current with your vaccines. Tell your health care provider if: You often feel depressed. You have ever been abused or do not feel safe at home. Summary Adopting a healthy lifestyle and getting preventive care are important in promoting health and wellness. Follow your health care provider's instructions about healthy diet, exercising, and getting tested or screened for diseases. Follow your  health care provider's instructions on monitoring your cholesterol and blood pressure. This information is not intended to replace advice given to you by your health care provider. Make sure you discuss any questions you have with your health care provider. Document Revised: 03/31/2021 Document Reviewed: 03/31/2021 Elsevier Patient Education  2024 [employer].

## 2023-09-16 NOTE — Assessment & Plan Note (Signed)
Cervical cancer screening: N/A due to benign RALTH Encouraged annual mammogram screening Colonoscopy UTD Labs and immunizations with her primary Encouraged safe sexual practices as indicated Encouraged healthy lifestyle practices with diet and exercise For patients under 50-51yo, I recommend 1200mg  calcium daily and 600IU of vitamin D daily.

## 2023-09-22 ENCOUNTER — Encounter: Payer: Self-pay | Admitting: Nurse Practitioner

## 2023-09-22 ENCOUNTER — Ambulatory Visit: Payer: 59 | Admitting: Nurse Practitioner

## 2023-09-22 VITALS — BP 130/88 | HR 68 | Ht 62.0 in | Wt 273.4 lb

## 2023-09-22 DIAGNOSIS — G4733 Obstructive sleep apnea (adult) (pediatric): Secondary | ICD-10-CM | POA: Diagnosis not present

## 2023-09-22 DIAGNOSIS — Z6841 Body Mass Index (BMI) 40.0 and over, adult: Secondary | ICD-10-CM

## 2023-09-22 DIAGNOSIS — E66813 Obesity, class 3: Secondary | ICD-10-CM

## 2023-09-22 NOTE — Assessment & Plan Note (Signed)
Moderate OSA with AHI 24.5/h. We discussed risks of untreated OSA and potential treatment options. Shared decision to move forward with CPAP therapy. Orders placed for auto CPAP 5-15 cmH2O, mask of choice and heated humidity. Educated on proper use/care of device. Risks/benefits reviewed. Healthy weight loss encouraged. Aware of safe driving practices.   Patient Instructions  Start CPAP 5-15 cmH2O, mask of choice, heated humidity, every night, minimum of 4-6 hours a night.  Change equipment as directed. Wash your tubing with warm soap and water daily, hang to dry. Wash humidifier portion weekly. Use bottled, distilled water and change daily Be aware of reduced alertness and do not drive or operate heavy machinery if experiencing this or drowsiness.  Exercise encouraged, as tolerated. Healthy weight management discussed.  Avoid or decrease alcohol consumption and medications that make you more sleepy, if possible. Notify if persistent daytime sleepiness occurs even with consistent use of PAP therapy.  We discussed how untreated sleep apnea puts an individual at risk for cardiac arrhthymias, pulm HTN, DM, stroke and increases their risk for daytime accidents. We also briefly reviewed treatment options including weight loss, side sleeping position, oral appliance, CPAP therapy or referral to ENT for possible surgical options  Change supplies.... Every month Mask cushions and/or nasal pillows CPAP machine filters Every 3 months Mask frame (not including the headgear) CPAP tubing Every 6 months Mask headgear Chin strap (if applicable) Humidifier water tub  Follow up in 10-12 weeks with Jessica Addison Aqueelah Cotrell,NP in virtual visit in Friday PM, or sooner, if needed

## 2023-09-22 NOTE — Progress Notes (Signed)
@Patient  ID: Jessica Contreras, female    DOB: [birth year], 52 y.o.   MRN: [id number]  Chief Complaint  Patient presents with   Follow-up    Pt is here for Sleep Study results.    Referring provider: Malka So., MD  HPI: 52 year old female, former smoker followed for sleep apnea. Past medical history significant for allergic rhinitis, GERD, obesity.   TEST/EVENTS:  08/01/2023 HST: AHI 24.5/h, SpO2 low 86%  05/20/2023: Ov with Katryna Tschirhart NP for sleep consult, referred by Romie Jumper, PA. She feels tired all day. She finds herself dozing in conversations sometimes. She has loud snoring. She does fall asleep quickly but doesn't feel like she gets into a deep sleep. Tosses and turns often. Wakes up feeling poorly rested. Woken up choking before. She has never been told she stops breathing when she sleeps. She has been having some trouble with headaches recently. No drowsy driving, sleep parasomnias/paralysis.  She goes to bed at various times. Falls asleep quickly. Wakes multiple times. Usually gets up around 6 am. No sleep aids. 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 No previous sleep studies. No oxygen use. No weight gain. She does not have any cardiac history, history of stroke, or diabetes. She is a never smoker. She drinks alcohol socially. She drinks 2 redbulls a day. She lives with her boyfriend. 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  Epworth 18  09/22/2023: Today - follow up Patient presents today for follow up to discuss home sleep study which revealed moderate OSA. She feels unchanged compared to our last visit. Has a lot of trouble with daytime sleepiness. Tends to doze off even during conversations. She has restless sleep at night. She denies any drowsy driving or sleep parasomnias/paralysis. She wants to discuss treatment options.   Allergies  Allergen Reactions   Morphine And Codeine Itching   Latex Rash    When exposed to latex for a long time per pt      There is no immunization history on file for this patient.  Past Medical History:  Diagnosis Date   Anemia 12/02/2021   Hgb 9.0   Arthritis    lower back   Breast pain, left 02/2022   not associated with sob or exertion, pt states she feels like the pain is in her breast   Chest wall pain 12/12/2018   EKG, chest x-ray and troponin negative. See MD note in Epic from 12/12/18 ED visit.   GERD (gastroesophageal reflux disease)    Gout 10-03-2017   right foot, pt had injury to her foot, was diagnosed with gout, but pt is unsure that she really had gout   Headache    hx of migraines when pt was in her 04-Oct-2023, still gets migraines occasionally   Heart murmur    as a child   HSV (herpes simplex virus) anogenital infection 08/2019   Upper buttocks   Hypertension    was on meds, but no longer needs per pt as of 03/02/22   Menorrhagia 12/12/2021   Panic attack 10/04/2007   after sister died   Pre-diabetes    Scratch of cornea    left eye, no problems, was told by eye doctor   Uterine fibroid 11/2021   Viral illness 07/2019   Pt stated that she had covid symptoms, fever, body aches, cought etc. She stated that she was sick for over a month and visited the doctor mutilple time. She tested negative for COVID  mutiple times.    Tobacco History: Social History   Tobacco Use  Smoking Status Former   Types: Cigars   Passive exposure: Current  Smokeless Tobacco Never  Tobacco Comments   Only smoked cigars socially. Patient stated that she had 1 cigar every 2 to 3 months. Hasn't smoked in 6 months as of 03/02/22.   Counseling given: Not Answered Tobacco comments: Only smoked cigars socially. Patient stated that she had 1 cigar every 2 to 3 months. Hasn't smoked in 6 months as of 03/02/22.   Outpatient Medications Prior to Visit  Medication Sig Dispense Refill   CALCIUM PO Take by mouth.     cetirizine (ZYRTEC) 10 MG tablet Take 10 mg by mouth daily.     Cholecalciferol (VITAMIN D-3 PO) Take by  mouth.     Docusate Calcium (STOOL SOFTENER PO) Take by mouth.     Famotidine (ACID REDUCER PO) Take by mouth.     FERREX 150 150 MG capsule Take 150 mg by mouth daily.     ibuprofen (ADVIL) 800 MG tablet Take 1 tablet (800 mg total) by mouth every 8 (eight) hours as needed. 30 tablet 1   valACYclovir (VALTREX) 500 MG tablet TAKE 1 TABLET BY MOUTH DAILY 90 tablet 0   No facility-administered medications prior to visit.     Review of Systems:   Constitutional: No weight loss or gain, night sweats, fevers, chills, or lassitude. +fatigue  HEENT: No difficulty swallowing, tooth/dental problems, or sore throat. No sneezing, itching, ear ache, nasal congestion, or post nasal drip. +headaches  CV:  No chest pain, orthopnea, PND, swelling in lower extremities, anasarca, dizziness, palpitations, syncope Resp: +snoring; shortness of breath with exertion (Baseline). No excess mucus or change in color of mucus. No productive or non-productive. No hemoptysis. No wheezing.  No chest wall deformity GI:  +controlled heartburn, indigestion. No abdominal pain, nausea, vomiting, diarrhea, change in bowel habits, loss of appetite, bloody stools.  GU: No dysuria, change in color of urine, urgency or frequency.   Skin: No rash, lesions, ulcerations MSK:  No joint pain or swelling.   Neuro: No dizziness or lightheadedness.  Psych: No depression or anxiety. Mood stable. +sleep disturbance     Physical Exam:  BP 130/88 (BP Location: Right Arm, Cuff Size: Large)   Pulse 68   Ht 5\' 2"  (1.575 m)   Wt 273 lb 6.4 oz (124 kg)   LMP 03/05/2022 (Approximate)   SpO2 99%   BMI 50.01 kg/m   GEN: Pleasant, interactive, well-appearing; morbidly obese; in no acute distress. HEENT:  Normocephalic and atraumatic. PERRLA. Sclera white. Nasal turbinates pink, moist and patent bilaterally. No rhinorrhea present. Oropharynx pink and moist, without exudate or edema. No lesions, ulcerations, or postnasal drip. Mallampati  IV NECK:  Supple w/ fair ROM. No JVD present. Normal carotid impulses w/o bruits. Thyroid symmetrical with no goiter or nodules palpated. No lymphadenopathy.   CV: RRR, no m/r/g, no peripheral edema. Pulses intact, +2 bilaterally. No cyanosis, pallor or clubbing. PULMONARY:  Unlabored, regular breathing. Clear bilaterally A&P w/o wheezes/rales/rhonchi. No accessory muscle use.  GI: BS present and normoactive. Soft, non-tender to palpation. No organomegaly or masses detected.  MSK: No erythema, warmth or tenderness. Cap refil <2 sec all extrem. No deformities or joint swelling noted.  Neuro: A/Ox3. No focal deficits noted.   Skin: Warm, no lesions or rashe Psych: Normal affect and behavior. Judgement and thought content appropriate.     Lab Results:  CBC  Component Value Date/Time   WBC 14.0 (H) 03/17/2022 1400   RBC 3.88 03/17/2022 1400   HGB 10.8 (L) 03/17/2022 1400   HCT 33.3 (L) 03/17/2022 1400   PLT 316 03/17/2022 1400   MCV 85.8 03/17/2022 1400   MCH 27.8 03/17/2022 1400   MCHC 32.4 03/17/2022 1400   RDW 20.4 (H) 03/17/2022 1400   LYMPHSABS 2.2 12/12/2018 1357   MONOABS 0.6 12/12/2018 1357   EOSABS 0.1 12/12/2018 1357   BASOSABS 0.0 12/12/2018 1357    BMET    Component Value Date/Time   NA 138 12/12/2018 1357   K 4.2 12/12/2018 1357   CL 106 12/12/2018 1357   CO2 22 12/12/2018 1357   GLUCOSE 94 12/12/2018 1357   BUN 10 12/12/2018 1357   CREATININE 0.86 12/12/2018 1357   CREATININE 0.91 11/09/2012 1210   CALCIUM 8.9 12/12/2018 1357   GFRNONAA >60 12/12/2018 1357   GFRAA >60 12/12/2018 1357    BNP No results found for: "BNP"   Imaging:  No results found.  Administration History     None           No data to display          No results found for: "NITRICOXIDE"      Assessment & Plan:   Moderate obstructive sleep apnea Moderate OSA with AHI 24.5/h. We discussed risks of untreated OSA and potential treatment options. Shared decision to  move forward with CPAP therapy. Orders placed for auto CPAP 5-15 cmH2O, mask of choice and heated humidity. Educated on proper use/care of device. Risks/benefits reviewed. Healthy weight loss encouraged. Aware of safe driving practices.   Patient Instructions  Start CPAP 5-15 cmH2O, mask of choice, heated humidity, every night, minimum of 4-6 hours a night.  Change equipment as directed. Wash your tubing with warm soap and water daily, hang to dry. Wash humidifier portion weekly. Use bottled, distilled water and change daily Be aware of reduced alertness and do not drive or operate heavy machinery if experiencing this or drowsiness.  Exercise encouraged, as tolerated. Healthy weight management discussed.  Avoid or decrease alcohol consumption and medications that make you more sleepy, if possible. Notify if persistent daytime sleepiness occurs even with consistent use of PAP therapy.  We discussed how untreated sleep apnea puts an individual at risk for cardiac arrhthymias, pulm HTN, DM, stroke and increases their risk for daytime accidents. We also briefly reviewed treatment options including weight loss, side sleeping position, oral appliance, CPAP therapy or referral to ENT for possible surgical options  Change supplies.... Every month Mask cushions and/or nasal pillows CPAP machine filters Every 3 months Mask frame (not including the headgear) CPAP tubing Every 6 months Mask headgear Chin strap (if applicable) Humidifier water tub  Follow up in 10-12 weeks with Florentina Addison Syesha Thaw,NP in virtual visit in Friday PM, or sooner, if needed    Class 3 severe obesity with body mass index (BMI) of 50.0 to 59.9 in adult (HCC) BMI 50. Healthy weight loss measures encouraged.    I spent 35 minutes of dedicated to the care of this patient on the date of this encounter to include pre-visit review of records, face-to-face time with the patient discussing conditions above, post visit ordering of  testing, clinical documentation with the electronic health record, making appropriate referrals as documented, and communicating necessary findings to members of the patients care team.  Noemi Chapel, NP 09/22/2023  Pt aware and understands NP's role.

## 2023-09-22 NOTE — Patient Instructions (Addendum)
Start CPAP 5-15 cmH2O, mask of choice, heated humidity, every night, minimum of 4-6 hours a night.  Change equipment as directed. Wash your tubing with warm soap and water daily, hang to dry. Wash humidifier portion weekly. Use bottled, distilled water and change daily Be aware of reduced alertness and do not drive or operate heavy machinery if experiencing this or drowsiness.  Exercise encouraged, as tolerated. Healthy weight management discussed.  Avoid or decrease alcohol consumption and medications that make you more sleepy, if possible. Notify if persistent daytime sleepiness occurs even with consistent use of PAP therapy.  We discussed how untreated sleep apnea puts an individual at risk for cardiac arrhthymias, pulm HTN, DM, stroke and increases their risk for daytime accidents. We also briefly reviewed treatment options including weight loss, side sleeping position, oral appliance, CPAP therapy or referral to ENT for possible surgical options  Change supplies.... Every month Mask cushions and/or nasal pillows CPAP machine filters Every 3 months Mask frame (not including the headgear) CPAP tubing Every 6 months Mask headgear Chin strap (if applicable) Humidifier water tub  Follow up in 10-12 weeks with Florentina Addison Mica Releford,NP in virtual visit in Friday PM, or sooner, if needed

## 2023-09-22 NOTE — Assessment & Plan Note (Signed)
BMI 50. Healthy weight loss measures encouraged.

## 2023-12-03 ENCOUNTER — Encounter: Payer: Self-pay | Admitting: Nurse Practitioner

## 2023-12-03 ENCOUNTER — Telehealth: Payer: 59 | Admitting: Nurse Practitioner

## 2023-12-03 DIAGNOSIS — Z6841 Body Mass Index (BMI) 40.0 and over, adult: Secondary | ICD-10-CM

## 2023-12-03 DIAGNOSIS — G4733 Obstructive sleep apnea (adult) (pediatric): Secondary | ICD-10-CM | POA: Diagnosis not present

## 2023-12-03 DIAGNOSIS — E66813 Obesity, class 3: Secondary | ICD-10-CM

## 2023-12-03 NOTE — Progress Notes (Signed)
 Patient ID: Jessica Contreras, female     DOB: Dec 10, 1970, 53 y.o.      MRN: [id number]  Chief Complaint  Patient presents with   Follow-up    F/u cpap, pt states she is doing good. She is having trouble with her mask, waking up and adjusting.    Virtual Visit via Video Note  I connected with Svetlana Bagby Boerner on 12/03/23 at  2:30 PM EST by a video enabled telemedicine application and verified that I am speaking with the correct person using two identifiers.  Location: Patient: Home Provider: Office   I discussed the limitations of evaluation and management by telemedicine and the availability of in person appointments. The patient expressed understanding and agreed to proceed.  History of Present Illness: 53 year old female, former smoker followed for sleep apnea. Past medical history significant for allergic rhinitis, GERD, obesity.    TEST/EVENTS:  08/01/2023 HST: AHI 24.5/h, SpO2 low 86%   05/20/2023: Ov with Marivel Mcclarty NP for sleep consult, referred by Joesph Cedar, PA. She feels tired all day. She finds herself dozing in conversations sometimes. She has loud snoring. She does fall asleep quickly but doesn't feel like she gets into a deep sleep. Tosses and turns often. Wakes up feeling poorly rested. Woken up choking before. She has never been told she stops breathing when she sleeps. She has been having some trouble with headaches recently. No drowsy driving, sleep parasomnias/paralysis.  She goes to bed at various times. Falls asleep quickly. Wakes multiple times. Usually gets up around 6 am. No sleep aids. 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 No previous sleep studies. No oxygen use. No weight gain. She does not have any cardiac history, history of stroke, or diabetes. She is a never smoker. She drinks alcohol socially. She drinks 2 redbulls a day. She lives with her boyfriend. 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  Epworth 18   09/22/2023: OV with Youa Deloney NP for  follow up to discuss home sleep study which revealed moderate OSA. She feels unchanged compared to our last visit. Has a lot of trouble with daytime sleepiness. Tends to doze off even during conversations. She has restless sleep at night. She denies any drowsy driving or sleep parasomnias/paralysis. She wants to discuss treatment options.   12/03/2023: Today - follow up Patient presents today for follow up after starting on CPAP therapy. She's been having some difficulties adjusting to therapy. Has to adjust her mask often at night. Wearing a full face mask. She does sleep on her side. She feels better on nights she wears it the entire night. She denies drowsy driving, sleep parasomnias/parlysis.  11/02/2023-12/01/2023: CPAP 5-15 cmH2O 25/30 days; 73% >4 hr; average use 5 hr 52 min Pressure 95th 14.7 Leaks 95th 11.7 AHI 1.1  Allergies  Allergen Reactions   Morphine And Codeine Itching   Latex Rash    When exposed to latex for a long time per pt    There is no immunization history on file for this patient. Past Medical History:  Diagnosis Date   Anemia 12/02/2021   Hgb 9.0   Arthritis    lower back   Breast pain, left 02/2022   not associated with sob or exertion, pt states she feels like the pain is in her breast   Chest wall pain 12/12/2018   EKG, chest x-ray and troponin negative. See MD note in Epic from 12/12/18 ED visit.   GERD (gastroesophageal reflux disease)  Gout 11-06-17   right foot, pt had injury to her foot, was diagnosed with gout, but pt is unsure that she really had gout   Headache    hx of migraines when pt was in her Nov 06, 2024, still gets migraines occasionally   Heart murmur    as a child   HSV (herpes simplex virus) anogenital infection 08/2019   Upper buttocks   Hypertension    was on meds, but no longer needs per pt as of 03/02/22   Menorrhagia 12/12/2021   Panic attack 11/07/07   after sister died   Pre-diabetes    Scratch of cornea    left eye, no problems, was  told by eye doctor   Uterine fibroid 11/2021   Viral illness 07/2019   Pt stated that she had covid symptoms, fever, body aches, cought etc. She stated that she was sick for over a month and visited the doctor mutilple time. She tested negative for COVID mutiple times.    Tobacco History: Social History   Tobacco Use  Smoking Status Former   Types: Cigars   Passive exposure: Current  Smokeless Tobacco Never  Tobacco Comments   Only smoked cigars socially. Patient stated that she had 1 cigar every 2 to 3 months. Hasn't smoked in 6 months as of 03/02/22.   Counseling given: Not Answered Tobacco comments: Only smoked cigars socially. Patient stated that she had 1 cigar every 2 to 3 months. Hasn't smoked in 6 months as of 03/02/22.   Outpatient Medications Prior to Visit  Medication Sig Dispense Refill   CALCIUM PO Take by mouth.     cetirizine (ZYRTEC) 10 MG tablet Take 10 mg by mouth daily.     Cholecalciferol (VITAMIN D-3 PO) Take by mouth.     Docusate Calcium (STOOL SOFTENER PO) Take by mouth.     Famotidine (ACID REDUCER PO) Take by mouth.     FERREX 150 150 MG capsule Take 150 mg by mouth daily.     ibuprofen  (ADVIL ) 800 MG tablet Take 1 tablet (800 mg total) by mouth every 8 (eight) hours as needed. 30 tablet 1   valACYclovir  (VALTREX ) 500 MG tablet TAKE 1 TABLET BY MOUTH DAILY 90 tablet 0   No facility-administered medications prior to visit.     Review of Systems:   Constitutional: No weight loss or gain, night sweats, fevers, chills, or lassitude. +fatigue (improved) HEENT: No headaches, difficulty swallowing, tooth/dental problems, or sore throat. No sneezing, itching, ear ache, nasal congestion, or post nasal drip CV:  No chest pain, orthopnea, PND, swelling in lower extremities, anasarca, dizziness, palpitations, syncope Resp: +snoring (without CPAP); baseline shortness of breath with exertion. No excess mucus or change in color of mucus. No productive or  non-productive. No hemoptysis. No wheezing.  No chest wall deformity GI:  +controlled heartburn, indigestion. No abdominal pain, nausea, vomiting, diarrhea, change in bowel habits, loss of appetite, bloody stools.  GU: No nocturia  Neuro: No dizziness or lightheadedness.  Psych: No depression or anxiety. Mood stable. +sleep disturbance   Observations/Objective: Patient is well-developed, well-nourished in no acute distress. A&Ox3. Resting comfortably at home. Unlabored breathing. Speech is clear and coherent with logical content.   Assessment and Plan: Moderate obstructive sleep apnea Moderate OSA on CPAP. Difficulties with mask fit, especially in side lying sleeping position. Will trial change to DreamWear full face or AirFit F40. Average pressure 14.7. Will adjust her to auto 10-18 cmH2O. Encouraged her to work on utilizing CPAP more nights  with new mask. Excellent control. Aware of proper use/care. Safe driving practices reviewed. Healthy weight loss encouraged.  Patient Instructions  Increase CPAP to every night, minimum of 4-6 hours a night.  Change equipment as directed. Wash your tubing with warm soap and water  daily, hang to dry. Wash humidifier portion weekly. Use bottled, distilled water  and change daily Be aware of reduced alertness and do not drive or operate heavy machinery if experiencing this or drowsiness.  Exercise encouraged, as tolerated. Healthy weight management discussed.  Avoid or decrease alcohol consumption and medications that make you more sleepy, if possible. Notify if persistent daytime sleepiness occurs even with consistent use of PAP therapy.   We discussed how untreated sleep apnea puts an individual at risk for cardiac arrhthymias, pulm HTN, DM, stroke and increases their risk for daytime accidents. We also briefly reviewed treatment options including weight loss, side sleeping position, oral appliance, CPAP therapy or referral to ENT for possible surgical  options   Change supplies.... Every month Mask cushions and/or nasal pillows CPAP machine filters Every 3 months Mask frame (not including the headgear) CPAP tubing Every 6 months Mask headgear Chin strap (if applicable) Humidifier water  tub  Change to DreamWear Full face mask or AirFit F40 Adjust setting 10-18 cmH2O   Follow up in 6 weeks with Izetta Miles Leyda,NP in virtual visit in Friday PM, or sooner, if needed    Class 3 severe obesity with body mass index (BMI) of 50.0 to 59.9 in adult [facility]) Healthy weight loss encouraged  I discussed the assessment and treatment plan with the patient. The patient was provided an opportunity to ask questions and all were answered. The patient agreed with the plan and demonstrated an understanding of the instructions.   The patient was advised to call back or seek an in-person evaluation if the symptoms worsen or if the condition fails to improve as anticipated.  I provided 31 minutes of non-face-to-face time during this encounter.   Comer LULLA Rouleau, NP

## 2023-12-03 NOTE — Assessment & Plan Note (Signed)
 Healthy weight loss encouraged

## 2023-12-03 NOTE — Patient Instructions (Addendum)
 Increase CPAP to every night, minimum of 4-6 hours a night.  Change equipment as directed. Wash your tubing with warm soap and water  daily, hang to dry. Wash humidifier portion weekly. Use bottled, distilled water  and change daily Be aware of reduced alertness and do not drive or operate heavy machinery if experiencing this or drowsiness.  Exercise encouraged, as tolerated. Healthy weight management discussed.  Avoid or decrease alcohol consumption and medications that make you more sleepy, if possible. Notify if persistent daytime sleepiness occurs even with consistent use of PAP therapy.   We discussed how untreated sleep apnea puts an individual at risk for cardiac arrhthymias, pulm HTN, DM, stroke and increases their risk for daytime accidents. We also briefly reviewed treatment options including weight loss, side sleeping position, oral appliance, CPAP therapy or referral to ENT for possible surgical options   Change supplies.... Every month Mask cushions and/or nasal pillows CPAP machine filters Every 3 months Mask frame (not including the headgear) CPAP tubing Every 6 months Mask headgear Chin strap (if applicable) Humidifier water  tub  Change to DreamWear Full face mask or AirFit F40 Adjust setting 10-18 cmH2O   Follow up in 6 weeks with Jessica Uyen Eichholz,NP in virtual visit in Friday PM, or sooner, if needed

## 2023-12-03 NOTE — Assessment & Plan Note (Addendum)
 Moderate OSA on CPAP. Difficulties with mask fit, especially in side lying sleeping position. Will trial change to DreamWear full face or AirFit F40. Average pressure 14.7. Will adjust her to auto 10-18 cmH2O. Encouraged her to work on utilizing CPAP more nights with new mask. Excellent control. Aware of proper use/care. Safe driving practices reviewed. Healthy weight loss encouraged.  Patient Instructions  Increase CPAP to every night, minimum of 4-6 hours a night.  Change equipment as directed. Wash your tubing with warm soap and water  daily, hang to dry. Wash humidifier portion weekly. Use bottled, distilled water  and change daily Be aware of reduced alertness and do not drive or operate heavy machinery if experiencing this or drowsiness.  Exercise encouraged, as tolerated. Healthy weight management discussed.  Avoid or decrease alcohol consumption and medications that make you more sleepy, if possible. Notify if persistent daytime sleepiness occurs even with consistent use of PAP therapy.   We discussed how untreated sleep apnea puts an individual at risk for cardiac arrhthymias, pulm HTN, DM, stroke and increases their risk for daytime accidents. We also briefly reviewed treatment options including weight loss, side sleeping position, oral appliance, CPAP therapy or referral to ENT for possible surgical options   Change supplies.... Every month Mask cushions and/or nasal pillows CPAP machine filters Every 3 months Mask frame (not including the headgear) CPAP tubing Every 6 months Mask headgear Chin strap (if applicable) Humidifier water  tub  Change to DreamWear Full face mask or AirFit F40 Adjust setting 10-18 cmH2O   Follow up in 6 weeks with Jessica Jarian Longoria,NP in virtual visit in Friday PM, or sooner, if needed

## 2024-01-07 ENCOUNTER — Telehealth: Payer: 59 | Admitting: Nurse Practitioner

## 2024-01-07 ENCOUNTER — Encounter: Payer: Self-pay | Admitting: Nurse Practitioner

## 2024-01-07 DIAGNOSIS — G4700 Insomnia, unspecified: Secondary | ICD-10-CM | POA: Insufficient documentation

## 2024-01-07 DIAGNOSIS — F5101 Primary insomnia: Secondary | ICD-10-CM

## 2024-01-07 DIAGNOSIS — G4733 Obstructive sleep apnea (adult) (pediatric): Secondary | ICD-10-CM | POA: Diagnosis not present

## 2024-01-07 MED ORDER — ESZOPICLONE 1 MG PO TABS: 1.0000 mg | ORAL_TABLET | Freq: Every evening | ORAL | 1 refills | Status: AC | PRN

## 2024-01-07 NOTE — Assessment & Plan Note (Signed)
Moderate OSA on CPAP. Good compliance and control. She would prefer a mask that doesn't go over her nose. Order was again placed for DreamWear full face or alternative - advised her to call DME Monday if she hasn't heard something regarding replacement. She does receive benefit from use. Aware of proper care. Healthy weight loss encouraged. Safe driving practices reviewed. Understands risks of untreated OSA.  Patient Instructions  Continue CPAP to every night, minimum of 4-6 hours a night.  Change equipment as directed. Wash your tubing with warm soap and water daily, hang to dry. Wash humidifier portion weekly. Use bottled, distilled water and change daily Be aware of reduced alertness and do not drive or operate heavy machinery if experiencing this or drowsiness.  Exercise encouraged, as tolerated. Healthy weight management discussed.  Avoid or decrease alcohol consumption and medications that make you more sleepy, if possible. Notify if persistent daytime sleepiness occurs even with consistent use of PAP therapy.   We discussed how untreated sleep apnea puts an individual at risk for cardiac arrhthymias, pulm HTN, DM, stroke and increases their risk for daytime accidents. We also briefly reviewed treatment options including weight loss, side sleeping position, oral appliance, CPAP therapy or referral to ENT for possible surgical options   Change supplies.... Every month Mask cushions and/or nasal pillows CPAP machine filters Every 3 months Mask frame (not including the headgear) CPAP tubing Every 6 months Mask headgear Chin strap (if applicable) Humidifier water tub   Change to DreamWear Full face mask or AirFit F40  - I put the order back in so you can call the medical supply company Monday to follow up on this   Start Lunesta 1 mg At bedtime as needed for sleep. Take immediately before bed. If you take 1 mg for a few nights and it doesn't help, you can go up to 2 mg and see how this  works. We can go up to 3 mg if needed but let me know how things are going before you do this. Ensure you have 7-8 hours in the bed after taking. Monitor for any mood changes or changes in sleep habits. Stop and notify immediately if these occur. Do not drive or operate heavy machinery after taking. Do not take with alcohol or other sedating medications. Ensure you apply your CPAP within 5-10 minutes of taking to avoid falling asleep without it. May cause some morning grogginess or vivid dreams.   Follow up in 6 weeks with Florentina Addison Shyan Scalisi,NP in virtual visit in Friday PM, or sooner, if needed

## 2024-01-07 NOTE — Patient Instructions (Signed)
Continue CPAP to every night, minimum of 4-6 hours a night.  Change equipment as directed. Wash your tubing with warm soap and water daily, hang to dry. Wash humidifier portion weekly. Use bottled, distilled water and change daily Be aware of reduced alertness and do not drive or operate heavy machinery if experiencing this or drowsiness.  Exercise encouraged, as tolerated. Healthy weight management discussed.  Avoid or decrease alcohol consumption and medications that make you more sleepy, if possible. Notify if persistent daytime sleepiness occurs even with consistent use of PAP therapy.   We discussed how untreated sleep apnea puts an individual at risk for cardiac arrhthymias, pulm HTN, DM, stroke and increases their risk for daytime accidents. We also briefly reviewed treatment options including weight loss, side sleeping position, oral appliance, CPAP therapy or referral to ENT for possible surgical options   Change supplies.... Every month Mask cushions and/or nasal pillows CPAP machine filters Every 3 months Mask frame (not including the headgear) CPAP tubing Every 6 months Mask headgear Chin strap (if applicable) Humidifier water tub   Change to DreamWear Full face mask or AirFit F40  - I put the order back in so you can call the medical supply company Monday to follow up on this   Start Lunesta 1 mg At bedtime as needed for sleep. Take immediately before bed. If you take 1 mg for a few nights and it doesn't help, you can go up to 2 mg and see how this works. We can go up to 3 mg if needed but let me know how things are going before you do this. Ensure you have 7-8 hours in the bed after taking. Monitor for any mood changes or changes in sleep habits. Stop and notify immediately if these occur. Do not drive or operate heavy machinery after taking. Do not take with alcohol or other sedating medications. Ensure you apply your CPAP within 5-10 minutes of taking to avoid falling asleep  without it. May cause some morning grogginess or vivid dreams.   Follow up in 6 weeks with Florentina Addison Michaelangelo Mittelman,NP in virtual visit in Friday PM, or sooner, if needed

## 2024-01-07 NOTE — Progress Notes (Signed)
Patient ID: Jessica Contreras, female     DOB: September 21, 1971, 53 y.o.      MRN: [id number]  No chief complaint on file.   Virtual Visit via Video Note  I connected with Jessica Contreras on 01/07/24 at  2:00 PM EST by a video enabled telemedicine application and verified that I am speaking with the correct person using two identifiers.  Location: Patient: Home Provider: Office   I discussed the limitations of evaluation and management by telemedicine and the availability of in person appointments. The patient expressed understanding and agreed to proceed.  History of Present Illness: 52 year old female, former smoker followed for sleep apnea. Past medical history significant for allergic rhinitis, GERD, obesity.    TEST/EVENTS:  08/01/2023 HST: AHI 24.5/h, SpO2 low 86%   05/20/2023: Ov with Okla Qazi NP for sleep consult, referred by Romie Jumper, PA. She feels tired all day. She finds herself dozing in conversations sometimes. She has loud snoring. She does fall asleep quickly but doesn't feel like she gets into a deep sleep. Tosses and turns often. Wakes up feeling poorly rested. Woken up choking before. She has never been told she stops breathing when she sleeps. She has been having some trouble with headaches recently. No drowsy driving, sleep parasomnias/paralysis.  She goes to bed at various times. Falls asleep quickly. Wakes multiple times. Usually gets up around 6 am. No sleep aids. 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 No previous sleep studies. No oxygen use. No weight gain. She does not have any cardiac history, history of stroke, or diabetes. She is a never smoker. She drinks alcohol socially. She drinks 2 redbulls a day. She lives with her boyfriend. 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  [place] 18   09/22/2023: OV with Chanze Teagle NP for follow up to discuss home sleep study which revealed moderate OSA. She feels unchanged compared to our last visit. Has a lot of  trouble with daytime sleepiness. Tends to doze off even during conversations. She has restless sleep at night. She denies any drowsy driving or sleep parasomnias/paralysis. She wants to discuss treatment options.   12/03/2023: OV with Cynde Menard NP for follow up after starting on CPAP therapy. She's been having some difficulties adjusting to therapy. Has to adjust her mask often at night. Wearing a full face mask. She does sleep on her side. She feels better on nights she wears it the entire night. She denies drowsy driving, sleep parasomnias/parlysis. 11/02/2023-12/01/2023: CPAP 5-15 cmH2O 25/30 days; 73% >4 hr; average use 5 hr 52 min Pressure 95th 14.7 Leaks 95th 11.7 AHI 1.1  01/07/2024: Today - follow up Patient presents today for follow-up via video visit. She has felt like CPAP pressures have been better. She never got the new mask. DME company never sent to her. She would still like to try this. She does feel better with her CPAP than without it. She unfortunately is still struggling with her sleep. Some nights can't fall asleep until 3-4 AM, which decreases her usage. She's not having any issues with leaks. Nothing specific keeping her awake. Mood stable. She denies sleep parasomnias/paralysis. No drowsy driving.   2/53/6644-0/34/7425: CPAP 10-18 cmH2O 22/30 days; 70% >4 hr; average use 5 hr 45 min Pressure 95th 17.1 Leaks 95th 5 AHI 2.9  Allergies  Allergen Reactions   Morphine And Codeine Itching   Latex Rash    When exposed to latex for a long time per pt  There is no immunization history on file for this patient. Past Medical History:  Diagnosis Date   Anemia 12/02/2021   Hgb 9.0   Arthritis    lower back   Breast pain, left 02/2022   not associated with sob or exertion, pt states she feels like the pain is in her breast   Chest wall pain 12/12/2018   EKG, chest x-ray and troponin negative. See MD note in Epic from 12/12/18 ED visit.   GERD (gastroesophageal reflux disease)     Gout January 28, 2017   right foot, pt had injury to her foot, was diagnosed with gout, but pt is unsure that she really had gout   Headache    hx of migraines when pt was in her 2024/01/29, still gets migraines occasionally   Heart murmur    as a child   HSV (herpes simplex virus) anogenital infection 08/2019   Upper buttocks   Hypertension    was on meds, but no longer needs per pt as of 03/02/22   Menorrhagia 12/12/2021   Panic attack 01/28/07   after sister died   Pre-diabetes    Scratch of cornea    left eye, no problems, was told by eye doctor   Uterine fibroid 11/2021   Viral illness 07/2019   Pt stated that she had covid symptoms, fever, body aches, cought etc. She stated that she was sick for over a month and visited the doctor mutilple time. She tested negative for COVID mutiple times.    Tobacco History: Social History   Tobacco Use  Smoking Status Former   Types: Cigars   Passive exposure: Current  Smokeless Tobacco Never  Tobacco Comments   Only smoked cigars socially. Patient stated that she had 1 cigar every 2 to 3 months. Hasn't smoked in 6 months as of 03/02/22.   Counseling given: Not Answered Tobacco comments: Only smoked cigars socially. Patient stated that she had 1 cigar every 2 to 3 months. Hasn't smoked in 6 months as of 03/02/22.   Outpatient Medications Prior to Visit  Medication Sig Dispense Refill   CALCIUM PO Take by mouth.     cetirizine (ZYRTEC) 10 MG tablet Take 10 mg by mouth daily.     Cholecalciferol (VITAMIN D-3 PO) Take by mouth.     Docusate Calcium (STOOL SOFTENER PO) Take by mouth.     Famotidine (ACID REDUCER PO) Take by mouth.     FERREX 150 150 MG capsule Take 150 mg by mouth daily.     ibuprofen (ADVIL) 800 MG tablet Take 1 tablet (800 mg total) by mouth every 8 (eight) hours as needed. 30 tablet 1   valACYclovir (VALTREX) 500 MG tablet TAKE 1 TABLET BY MOUTH DAILY 90 tablet 0   No facility-administered medications prior to visit.     Review of  Systems:   Constitutional: No weight loss or gain, night sweats, fevers, chills, or lassitude. +fatigue (improved) HEENT: No headaches, difficulty swallowing, tooth/dental problems, or sore throat. No sneezing, itching, ear ache, nasal congestion, or post nasal drip CV:  No chest pain, orthopnea, PND, swelling in lower extremities, anasarca, dizziness, palpitations, syncope Resp: +snoring (without CPAP); baseline shortness of breath with exertion. No excess mucus or change in color of mucus. No productive or non-productive. No hemoptysis. No wheezing.  No chest wall deformity GI:  +controlled heartburn, indigestion. No abdominal pain, nausea, vomiting, diarrhea, change in bowel habits, loss of appetite, bloody stools.  GU: No nocturia  Neuro: No dizziness or  lightheadedness.  Psych: No depression or anxiety. Mood stable. +sleep disturbance   Observations/Objective: Patient is well-developed, well-nourished in no acute distress. A&Ox3. Resting comfortably at home. Unlabored breathing. Speech is clear and coherent with logical content.   Assessment and Plan: Moderate obstructive sleep apnea Moderate OSA on CPAP. Good compliance and control. She would prefer a mask that doesn't go over her nose. Order was again placed for DreamWear full face or alternative - advised her to call DME Monday if she hasn't heard something regarding replacement. She does receive benefit from use. Aware of proper care. Healthy weight loss encouraged. Safe driving practices reviewed. Understands risks of untreated OSA.  Patient Instructions  Continue CPAP to every night, minimum of 4-6 hours a night.  Change equipment as directed. Wash your tubing with warm soap and water daily, hang to dry. Wash humidifier portion weekly. Use bottled, distilled water and change daily Be aware of reduced alertness and do not drive or operate heavy machinery if experiencing this or drowsiness.  Exercise encouraged, as tolerated. Healthy  weight management discussed.  Avoid or decrease alcohol consumption and medications that make you more sleepy, if possible. Notify if persistent daytime sleepiness occurs even with consistent use of PAP therapy.   We discussed how untreated sleep apnea puts an individual at risk for cardiac arrhthymias, pulm HTN, DM, stroke and increases their risk for daytime accidents. We also briefly reviewed treatment options including weight loss, side sleeping position, oral appliance, CPAP therapy or referral to ENT for possible surgical options   Change supplies.... Every month Mask cushions and/or nasal pillows CPAP machine filters Every 3 months Mask frame (not including the headgear) CPAP tubing Every 6 months Mask headgear Chin strap (if applicable) Humidifier water tub   Change to DreamWear Full face mask or AirFit F40  - I put the order back in so you can call the medical supply company Monday to follow up on this   Start Lunesta 1 mg At bedtime as needed for sleep. Take immediately before bed. If you take 1 mg for a few nights and it doesn't help, you can go up to 2 mg and see how this works. We can go up to 3 mg if needed but let me know how things are going before you do this. Ensure you have 7-8 hours in the bed after taking. Monitor for any mood changes or changes in sleep habits. Stop and notify immediately if these occur. Do not drive or operate heavy machinery after taking. Do not take with alcohol or other sedating medications. Ensure you apply your CPAP within 5-10 minutes of taking to avoid falling asleep without it. May cause some morning grogginess or vivid dreams.   Follow up in 6 weeks with Florentina Addison Jawann Urbani,NP in virtual visit in Friday PM, or sooner, if needed    Insomnia Persistent insomnia symptoms despite CPAP. Primary issue with sleep onset. Latency >1 hr some nights. Will trial her on sleep aid with Lunesta. Side effect profile/risk profile reviewed. Aware to notify of any new  symptoms/sleep habits or mood changes.    I discussed the assessment and treatment plan with the patient. The patient was provided an opportunity to ask questions and all were answered. The patient agreed with the plan and demonstrated an understanding of the instructions.   The patient was advised to call back or seek an in-person evaluation if the symptoms worsen or if the condition fails to improve as anticipated.  I provided 31 minutes of non-face-to-face  time during this encounter.   Jessica Chapel, NP

## 2024-01-07 NOTE — Assessment & Plan Note (Signed)
Persistent insomnia symptoms despite CPAP. Primary issue with sleep onset. Latency >1 hr some nights. Will trial her on sleep aid with Lunesta. Side effect profile/risk profile reviewed. Aware to notify of any new symptoms/sleep habits or mood changes.

## 2024-11-02 ENCOUNTER — Other Ambulatory Visit: Payer: Self-pay | Admitting: Orthopedic Surgery

## 2024-11-02 DIAGNOSIS — M25561 Pain in right knee: Secondary | ICD-10-CM

## 2024-11-07 ENCOUNTER — Inpatient Hospital Stay
Admission: RE | Admit: 2024-11-07 | Discharge: 2024-11-07 | Attending: Orthopedic Surgery | Admitting: Orthopedic Surgery

## 2024-11-07 DIAGNOSIS — M25561 Pain in right knee: Secondary | ICD-10-CM
